# Patient Record
Sex: Male | Born: 1945 | Race: Black or African American | Hispanic: No | Marital: Married | Smoking: Former smoker
Health system: Southern US, Community
[De-identification: ages and names within clinical notes are randomized; demographics above are authoritative.]

## PROBLEM LIST (undated history)

## (undated) DIAGNOSIS — I639 Cerebral infarction, unspecified: Secondary | ICD-10-CM

## (undated) DIAGNOSIS — I10 Essential (primary) hypertension: Secondary | ICD-10-CM

## (undated) HISTORY — DX: Cerebral infarction, unspecified: I63.9

## (undated) HISTORY — DX: Essential (primary) hypertension: I10

---

## 2018-08-31 ENCOUNTER — Ambulatory Visit (INDEPENDENT_AMBULATORY_CARE_PROVIDER_SITE_OTHER): Payer: Medicare HMO | Admitting: Family Medicine

## 2018-08-31 ENCOUNTER — Encounter: Payer: Self-pay | Admitting: Family Medicine

## 2018-08-31 VITALS — BP 130/68 | HR 50 | Temp 97.9°F | Ht 69.0 in | Wt 223.2 lb

## 2018-08-31 DIAGNOSIS — E785 Hyperlipidemia, unspecified: Secondary | ICD-10-CM

## 2018-08-31 DIAGNOSIS — I10 Essential (primary) hypertension: Secondary | ICD-10-CM | POA: Insufficient documentation

## 2018-08-31 DIAGNOSIS — M25572 Pain in left ankle and joints of left foot: Secondary | ICD-10-CM

## 2018-08-31 DIAGNOSIS — K4090 Unilateral inguinal hernia, without obstruction or gangrene, not specified as recurrent: Secondary | ICD-10-CM

## 2018-08-31 DIAGNOSIS — Z8673 Personal history of transient ischemic attack (TIA), and cerebral infarction without residual deficits: Secondary | ICD-10-CM

## 2018-08-31 DIAGNOSIS — L0390 Cellulitis, unspecified: Secondary | ICD-10-CM | POA: Diagnosis not present

## 2018-08-31 HISTORY — DX: Personal history of transient ischemic attack (TIA), and cerebral infarction without residual deficits: Z86.73

## 2018-08-31 HISTORY — DX: Essential (primary) hypertension: I10

## 2018-08-31 HISTORY — DX: Hyperlipidemia, unspecified: E78.5

## 2018-08-31 HISTORY — DX: Unilateral inguinal hernia, without obstruction or gangrene, not specified as recurrent: K40.90

## 2018-08-31 MED ORDER — DOXYCYCLINE HYCLATE 100 MG PO TABS: 100.0000 mg | ORAL_TABLET | Freq: Two times a day (BID) | ORAL | 0 refills | Status: DC

## 2018-08-31 NOTE — Assessment & Plan Note (Signed)
At goal.  Continue Norvasc 10 mg daily, HCTZ 12.5 mg daily, and losartan 25 mg daily.

## 2018-08-31 NOTE — Assessment & Plan Note (Signed)
-  Continue Lipitor 10mg daily

## 2018-08-31 NOTE — Patient Instructions (Signed)
It was very nice to see you today!  I think you have a small infection in your ankle.  We will treat this with an antibiotic.  Please start the doxycycline 100 mg twice daily for the next 7 days.  We need to get an MRI of your ankle make sure that she did not injure your Achilles tendon.  You will be called to get that set up.  Please come back to see me in 3 to 6 months for your follow-up visit, or sooner as needed.  Take care, Dr Jimmey RalphParker

## 2018-08-31 NOTE — Assessment & Plan Note (Signed)
Patient declined exam today.  Does not want surgery.  Discussed warning signs and reasons to seek emergent care.

## 2018-08-31 NOTE — Progress Notes (Signed)
Subjective:  Albert [name] is a 72 y.o. male who presents today with a chief complaint of foot pain and to establish care.  HPI:  Foot Pain, new problem Started a few weeks ago.  [name]  [name] but has had persistent pain to the back of his left ankle.  Pain is worse with walking and with standing.  [name]  No fevers or chills.  No other treatments tried.  No weakness.  No other obvious alleviating or aggravating factors.  Inguinal hernia, chronic problem Started several months ago.  Located in left groin.  Worse with coughing.  Worse with picking up heavy objects.  No nausea or vomiting.  No constipation or diarrhea.  Does not want surgery at this point.  His other chronic, stable medical conditions are outlined below 1.  Hypertension.  On Norvasc 10 mg daily, losartan 25 mg daily, and HCTZ 12.5 mg daily.  Tolerating all these well without side effects. 2.  Dyslipidemia/history of stroke.  On Lipitor 10 mg daily and tolerating well.  ROS: Per HPI, otherwise a complete review of systems was negative.   PMH:  The following were reviewed and entered/updated in epic: Past Medical History:  Diagnosis Date  . Hypertension   . Stroke [facility](HCC)    Patient Active Problem List   Diagnosis Date Noted  . Essential hypertension 08/31/2018  . Dyslipidemia 08/31/2018  . History of stroke 08/31/2018  . Inguinal hernia 08/31/2018   History reviewed. No pertinent surgical history.  Family History  Problem Relation Age of Onset  . Heart disease Mother   . Hypertension Mother   . Stroke Mother   . Heart disease Father   . Hypertension Father   . Heart disease Brother     Medications- reviewed and updated Current Outpatient Medications  Medication Sig Dispense Refill  . amLODipine (NORVASC) 10 MG tablet Take by  mouth daily.    Marland Kitchen. atorvastatin (LIPITOR) 10 MG tablet Take 10 mg by mouth daily.    . hydrochlorothiazide (MICROZIDE) 12.5 MG capsule Take 12.5 mg by mouth daily.    Marland Kitchen. losartan (COZAAR) 25 MG tablet Take by mouth daily.    Marland Kitchen. doxycycline (VIBRA-TABS) 100 MG tablet Take 1 tablet (100 mg total) by mouth 2 (two) times daily. 14 tablet 0   No current facility-administered medications for this visit.     Allergies-reviewed and updated Allergies  Allergen Reactions  . Accupril [Quinapril Hcl] Swelling  . Shellfish Allergy   . Aspirin Nausea Only    Social History   Socioeconomic History  . Marital status: Married    Spouse name: Not on file  . Number of children: Not on file  . Years of education: Not on file  . Highest education level: Not on file  Occupational History  . Not on file  Social Needs  . Financial resource strain: Not on file  . Food insecurity:    Worry: Not on file    Inability: Not on file  . Transportation needs:    Medical: Not on file    Non-medical: Not on file  Tobacco Use  . Smoking status: Former Smoker    Last attempt to quit: 1976    Years since quitting: 44.0  . Smokeless tobacco: Never Used  Substance and Sexual Activity  .  Alcohol use: Not Currently    Frequency: Never  . Drug use: Never  . Sexual activity: Not on file  Lifestyle  . Physical activity:    Days per week: Not on file    Minutes per session: Not on file  . Stress: Not on file  Relationships  . Social connections:    Talks on phone: Not on file    Gets together: Not on file    Attends religious service: Not on file    Active member of club or organization: Not on file    Attends meetings of clubs or organizations: Not on file    Relationship status: Not on file  Other Topics Concern  . Not on file  Social History Narrative  . Not on file    Objective:  Physical Exam: BP 130/68 (BP Location: Left Arm, Patient Position: Sitting, Cuff Size: Large)   Pulse (!) 50   Temp  97.9 F (36.6 C) (Oral)   Ht 5\' 9"  (1.753 m)   Wt 223 lb 4 oz (101.3 kg)   SpO2 99%   BMI 32.97 kg/m   Gen: NAD, resting comfortably CV: RRR with no murmurs appreciated Pulm: NWOB, CTAB with no crackles, wheezes, or rhonchi GI: Normal bowel sounds present. Soft, Nontender, Nondistended. MSK:  -Left foot: Left Achilles tendon with open wound approximately 2-1/2 cm above calcaneus.  Wound is approximately 1-1/2 cm in diameter.  Mild amount of serosanguineous drainage.  Surrounding erythema and edema.  Very tender to palpation.  No obvious areas of fluctuance.  Normal Thompson squeeze test.  Able to do heel raise however this elicits pain. Skin: Warm, dry Neuro: Grossly normal, moves all extremities Psych: Normal affect and thought content  Assessment/Plan:  Left ankle pain/cellulitis Concern for soft tissue infection giving erythema, edema, and open wound.  Start doxycycline 100 mg twice daily for 7 days.  Normal Thompson squeeze test however has significant pain with heel raise.  It is possible he has a partially lacerated Achilles tendon.  Discussed with sports medicine (Dr Berline Choughigby).  Recommended getting MRI to rule out partial Achilles tear.  Discussed reasons return to care and seek emergent care.  History of stroke Continue statin.  Essential hypertension At goal.  Continue Norvasc 10 mg daily, HCTZ 12.5 mg daily, and losartan 25 mg daily.  Dyslipidemia Continue Lipitor 10 mg daily.  Inguinal hernia Patient declined exam today.  Does not want surgery.  Discussed warning signs and reasons to seek emergent care.  Preventative Healthcare [name].  Obtain records from previous PCP. Health Maintenance Due  Topic Date Due  . Hepatitis C Screening  31-Jul-1946  . TETANUS/TDAP  03/05/1965  . COLONOSCOPY  03/05/1996  . PNA vac Low Risk Adult (1 of 2 - PCV13) 03/06/2011   Yussef Jorge M. Jimmey RalphParker, MD 08/31/2018 9:20 AM

## 2018-08-31 NOTE — Assessment & Plan Note (Signed)
Continue statin. 

## 2018-10-19 ENCOUNTER — Encounter: Payer: Self-pay | Admitting: Family Medicine

## 2018-10-19 ENCOUNTER — Ambulatory Visit: Payer: Medicare HMO | Admitting: Family Medicine

## 2018-10-19 VITALS — BP 128/72 | HR 67 | Temp 98.4°F | Ht 69.0 in | Wt 183.2 lb

## 2018-10-19 DIAGNOSIS — R109 Unspecified abdominal pain: Secondary | ICD-10-CM | POA: Diagnosis not present

## 2018-10-19 DIAGNOSIS — R1030 Lower abdominal pain, unspecified: Secondary | ICD-10-CM

## 2018-10-19 LAB — POCT URINALYSIS DIPSTICK
Bilirubin, UA: NEGATIVE
Blood, UA: NEGATIVE
Glucose, UA: NEGATIVE
Ketones, UA: NEGATIVE
Leukocytes, UA: NEGATIVE
Nitrite, UA: NEGATIVE
Protein, UA: NEGATIVE
Spec Grav, UA: 1.015 (ref 1.010–1.025)
Urobilinogen, UA: 2 E.U./dL — AB
pH, UA: 7.5 (ref 5.0–8.0)

## 2018-10-19 NOTE — Progress Notes (Signed)
   Chief Complaint:  Albert [name] is a 73 y.o. male who presents today with a chief complaint of left flank pain.   Assessment/Plan:  Left Flank Pain No red flags.  Benign abdominal exam with no signs of acute abdomen.  UA without blood or signs of infection.  Symptoms likely coming from his inguinal hernia.  Will check CT scan of abdomen pelvis with contrast to further evaluate this and rule out other potential causes.  His pain is currently tolerable-recommended over-the-counter analgesics as needed.  Discussed reasons to return to care and seek emergent care.    Subjective:  HPI:  Left Flank Pain Started 4 to 5 weeks ago.  Stable over that time.  No clear precipitating event.  He has had a history of a left inguinal hernia but is not sure if this is contributing or not.  No dysuria.  No hematuria.  No nausea or vomiting.  No constipation or diarrhea.  He has 2 soft bowel movements daily.  No recent illnesses.  No fevers or chills.  No treatments tried.  Symptoms seem to be worse in the morning and when getting up.  No other obvious alleviating or aggravating factors.  ROS: Per HPI  PMH: He reports that he quit smoking about 44 years ago. He has never used smokeless tobacco. He reports previous alcohol use. He reports that he does not use drugs.      Objective:  Physical Exam: BP 128/72 (BP Location: Left Arm, Patient Position: Sitting, Cuff Size: Normal)   Pulse 67   Temp 98.4 F (36.9 C) (Oral)   Ht 5\' 9"  (1.753 m)   Wt 183 lb 3.2 oz (83.1 kg)   SpO2 97%   BMI 27.05 kg/m   Gen: NAD, resting comfortably CV: Regular rate and rhythm with no murmurs appreciated Pulm: Normal work of breathing, clear to auscultation bilaterally with no crackles, wheezes, or rhonchi GI: Normal bowel sounds present. Soft, mild tenderness to palpation in left lower quadrant.  No rebound or guarding. GU: Deferred.   Results for orders placed or performed in visit on 10/19/18 (from the past 24 hour(s))   POCT urinalysis dipstick     Status: Abnormal   Collection Time: 10/19/18  2:16 PM  Result Value Ref Range   Color, UA yellow    Clarity, UA clear    Glucose, UA Negative Negative   Bilirubin, UA Negative    Ketones, UA Negative    Spec Grav, UA 1.015 1.010 - 1.025   Blood, UA Negative    pH, UA 7.5 5.0 - 8.0   Protein, UA Negative Negative   Urobilinogen, UA 2.0 (A) 0.2 or 1.0 E.U./dL   Nitrite, UA Negative    Leukocytes, UA Negative Negative   Appearance     Odor           Bralyn Espino M. [name], MD 10/19/2018 2:31 PM

## 2018-10-19 NOTE — Patient Instructions (Signed)
It was very nice to see you today!  Your pain could potentially be coming from your hernia.  We will check a CT scan to make sure there is nothing else going on.  Please let us know if your pain significantly worsens or if you develop any other symptoms.  Take care, Dr [name]

## 2019-07-23 ENCOUNTER — Ambulatory Visit: Payer: Medicare HMO

## 2019-12-24 ENCOUNTER — Ambulatory Visit: Payer: Medicare HMO

## 2020-01-09 ENCOUNTER — Encounter: Payer: Self-pay | Admitting: Family Medicine

## 2020-01-09 ENCOUNTER — Encounter: Payer: Self-pay | Admitting: Internal Medicine

## 2020-01-09 ENCOUNTER — Ambulatory Visit (INDEPENDENT_AMBULATORY_CARE_PROVIDER_SITE_OTHER): Payer: Medicare (Managed Care) | Admitting: Family Medicine

## 2020-01-09 ENCOUNTER — Other Ambulatory Visit: Payer: Self-pay

## 2020-01-09 VITALS — BP 140/80 | HR 65 | Temp 98.4°F | Ht 69.0 in | Wt 181.2 lb

## 2020-01-09 DIAGNOSIS — Z1211 Encounter for screening for malignant neoplasm of colon: Secondary | ICD-10-CM | POA: Diagnosis not present

## 2020-01-09 DIAGNOSIS — M5441 Lumbago with sciatica, right side: Secondary | ICD-10-CM

## 2020-01-09 DIAGNOSIS — M5442 Lumbago with sciatica, left side: Secondary | ICD-10-CM

## 2020-01-09 MED ORDER — DICLOFENAC SODIUM 75 MG PO TBEC: 75.0000 mg | DELAYED_RELEASE_TABLET | Freq: Two times a day (BID) | ORAL | 0 refills | Status: DC

## 2020-01-09 NOTE — Patient Instructions (Signed)
It was very nice to see you today!  Please start the anti-inflammatory.  Continue with a heating pad and work on the exercises.  Letter know if not proving the next 1 to 2 weeks.  I will also place referral for you get your colon cancer screening.  Take care, Dr [name]  Please try these tips to maintain a healthy lifestyle:   Eat at least 3 REAL meals and 1-2 snacks per day.  Aim for no more than 5 hours between eating.  If you eat breakfast, please do so within one hour of getting up.    Each meal should contain half fruits/vegetables, one quarter protein, and one quarter carbs (no bigger than a computer mouse)   Cut down on sweet beverages. This includes juice, soda, and sweet tea.     Drink at least 1 glass of water with each meal and aim for at least 8 glasses per day   Exercise at least 150 minutes every week.

## 2020-01-09 NOTE — Progress Notes (Signed)
   Zyon Rosser is a 74 y.o. male who presents today for an office visit.  Assessment/Plan:  New/Acute Problems: Low back pain with sciatica No red flags.  Start diclofenac 75 mg twice daily for the next 1 to 2 weeks.  Discussed home exercise program and handout was given.  If not improving in the next week or 2 will need x-ray.  May consider referral to PT and/or sports med if not improving.  Preventive healthcare We will place referral for colon cancer screening per patient request.   Subjective:  HPI:  Patient with low back pain for the past few weeks.  Stable over that time.  Located bilateral lower back that radiates into bilateral lower legs.  Describes a soreness sensation.  Has tried using a warm towel which is helped.  No specific precipitating events or injuries.  No medications tried.  No bowel or bladder incontinence.  No urinary retention.       Objective:  Physical Exam: BP 140/80 (BP Location: Left Arm, Patient Position: Sitting, Cuff Size: Large)   Pulse 65   Temp 98.4 F (36.9 C) (Temporal)   Ht 5\' 9"  (1.753 m)   Wt 181 lb 3.2 oz (82.2 kg)   SpO2 98%   BMI 26.76 kg/m   Gen: No acute distress, resting comfortably MSK: -Back: No deformities.  Slightly tender to palpation along bilateral lumbar paraspinal muscles -Lower extremities: No deformities.  Forage motion throughout.  Strength 5 out of 5 throughout.  Sensation light touch intact throughout. Psych: Normal affect and thought content      Miasha Emmons M. , MD 01/09/2020 10:35 AM

## 2020-02-15 ENCOUNTER — Other Ambulatory Visit: Payer: Self-pay

## 2020-02-18 ENCOUNTER — Encounter: Payer: Self-pay | Admitting: Family Medicine

## 2020-02-18 ENCOUNTER — Ambulatory Visit (INDEPENDENT_AMBULATORY_CARE_PROVIDER_SITE_OTHER): Payer: Medicare (Managed Care) | Admitting: Family Medicine

## 2020-02-18 ENCOUNTER — Other Ambulatory Visit: Payer: Self-pay

## 2020-02-18 ENCOUNTER — Ambulatory Visit (INDEPENDENT_AMBULATORY_CARE_PROVIDER_SITE_OTHER)
Admission: RE | Admit: 2020-02-18 | Discharge: 2020-02-18 | Disposition: A | Discharge status: Home / Self Care | Payer: Medicare (Managed Care) | Source: Ambulatory Visit | Attending: Family Medicine | Admitting: Family Medicine

## 2020-02-18 ENCOUNTER — Encounter: Payer: Self-pay | Admitting: Internal Medicine

## 2020-02-18 VITALS — BP 138/72 | HR 61 | Temp 98.0°F | Ht 69.0 in | Wt 181.0 lb

## 2020-02-18 DIAGNOSIS — M5442 Lumbago with sciatica, left side: Secondary | ICD-10-CM

## 2020-02-18 DIAGNOSIS — Z1211 Encounter for screening for malignant neoplasm of colon: Secondary | ICD-10-CM

## 2020-02-18 DIAGNOSIS — M5441 Lumbago with sciatica, right side: Secondary | ICD-10-CM

## 2020-02-18 MED ORDER — DICLOFENAC SODIUM 75 MG PO TBEC: 75.0000 mg | DELAYED_RELEASE_TABLET | Freq: Two times a day (BID) | ORAL | 0 refills | Status: DC

## 2020-02-18 NOTE — Progress Notes (Signed)
   [place]fle[place] Capers is a 74 y.o. male who presents today for an office visit.  Assessment/Plan:  New/Acute Problems: Low Back Pain No red flags though given symptoms of pain recurring over the past 6 weeks or so will check x-ray today.  Restart diclofenac.  Advised him to avoid any activities that worsen his back pain over the next 4 to 6 weeks.  Work note was given today.  If not improving the next 2 weeks would consider referral to sports medicine.    Subjective:  HPI:  Patient here for back pain.  Last seen about 6 weeks ago.  Started on diclofenac.  Symptoms improved however worsened over the last few weeks.  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  Pain in the low back radiates into bilateral legs.  No reported bowel or bladder incontinence.  No reported no urinary retention.       Objective:  Physical Exam: BP 138/72   Pulse 61   Temp 98 F (36.7 C)   Ht 5\' 9"  (1.753 m)   Wt 181 lb (82.1 kg)   SpO2 97%   BMI 26.73 kg/m   Gen: No acute distress, resting comfortably CV: Regular rate and rhythm with no murmurs appreciated Pulm: Normal work of breathing, clear to auscultation bilaterally with no crackles, wheezes, or rhonchi Neuro: Grossly normal, moves all extremities Psych: Normal affect and thought content      Corene Resnick M. , MD 02/18/2020 11:42 AM

## 2020-02-18 NOTE — Patient Instructions (Signed)
It was very nice to see you today!  Please restart the diclofenac.  We will check an x-ray today.  Please avoid strenuous activity for the next 4 to 6 weeks.  Please let me know if not improving and I will refer you to see a sports medicine doctor.  Take care, Dr [name]  Please try these tips to maintain a healthy lifestyle:   Eat at least 3 REAL meals and 1-2 snacks per day.  Aim for no more than 5 hours between eating.  If you eat breakfast, please do so within one hour of getting up.    Each meal should contain half fruits/vegetables, one quarter protein, and one quarter carbs (no bigger than a computer mouse)   Cut down on sweet beverages. This includes juice, soda, and sweet tea.     Drink at least 1 glass of water with each meal and aim for at least 8 glasses per day   Exercise at least 150 minutes every week.

## 2020-02-19 NOTE — Progress Notes (Signed)
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 Would like for him to continue with his current treatment plan and let me know if not improving.  Katina Degree. [name], MD 02/19/2020 9:17 AM

## 2020-03-08 ENCOUNTER — Other Ambulatory Visit: Payer: Self-pay | Admitting: Family Medicine

## 2020-03-17 ENCOUNTER — Other Ambulatory Visit: Payer: Self-pay

## 2020-03-17 ENCOUNTER — Ambulatory Visit (INDEPENDENT_AMBULATORY_CARE_PROVIDER_SITE_OTHER): Payer: Medicare (Managed Care)

## 2020-03-17 DIAGNOSIS — Z1211 Encounter for screening for malignant neoplasm of colon: Secondary | ICD-10-CM

## 2020-03-17 DIAGNOSIS — Z0000 Encounter for general adult medical examination without abnormal findings: Secondary | ICD-10-CM | POA: Diagnosis not present

## 2020-03-17 NOTE — Progress Notes (Addendum)
Subjective:   Albert [name] is a 74 y.o. male who presents for an Initial Medicare Annual Wellness Visit.  Virtual Visit via Telephone Note  I connected with  Albert [name] on 03/17/20 at  3:15 PM EDT by telephone and verified that I am speaking with the correct person using two identifiers.  Medicare Annual Wellness visit completed telephonically due to Covid-19 pandemic.   Persons participating in this call: This Health Coach and this patient.   Location: Patient: Home Provider: Office   I discussed the limitations, risks, security and privacy concerns of performing an evaluation and management service by telephone and the availability of in person appointments. The patient expressed understanding and agreed to proceed.  Unable to perform video visit due to video visit attempted and failed and/or patient does not have video capability.   Some vital signs may be absent or patient reported.   [name], LPN       Review of Systems     Cardiac Risk Factors include: hypertension;male gender;dyslipidemia     Objective:    There were no vitals filed for this visit. There is no height or weight on file to calculate BMI.  Advanced Directives 03/17/2020  Does Patient Have a Medical Advance Directive? Yes  Type of Estate agent of [place];Living will  Copy of Healthcare Power of Attorney in Chart? No - copy requested    Current Medications (verified) Outpatient Encounter Medications as of 03/17/2020  Medication Sig  . amLODipine (NORVASC) 10 MG tablet Take by mouth daily.  Marland Kitchen atorvastatin (LIPITOR) 10 MG tablet Take 10 mg by mouth daily.  . diclofenac (VOLTAREN) 75 MG EC tablet TAKE 1 TABLET BY MOUTH TWICE A DAY  . hydrochlorothiazide (MICROZIDE) 12.5 MG capsule Take 12.5 mg by mouth daily.  Marland Kitchen losartan (COZAAR) 25 MG tablet Take by mouth daily.   No facility-administered encounter medications on file as of 03/17/2020.    Allergies  (verified) Accupril [quinapril hcl], Shellfish allergy, and Aspirin   History: Past Medical History:  Diagnosis Date  . Hypertension   . Stroke [facility])    History reviewed. No pertinent surgical history. Family History  Problem Relation Age of Onset  . Heart disease Mother   . Hypertension Mother   . Stroke Mother   . Heart disease Father   . Hypertension Father   . Heart disease Brother    Social History   Socioeconomic History  . Marital status: Married    Spouse name: Not on file  . Number of children: Not on file  . Years of education: Not on file  . Highest education level: Not on file  Occupational History  . Not on file  Tobacco Use  . Smoking status: Former Smoker    Quit date: 1976    Years since quitting: 45.5  . Smokeless tobacco: Never Used  Vaping Use  . Vaping Use: Never used  Substance and Sexual Activity  . Alcohol use: Not Currently  . Drug use: Never  . Sexual activity: Not on file  Other Topics Concern  . Not on file  Social History Narrative  . Not on file   Social Determinants of Health   Financial Resource Strain: Low Risk   . Difficulty of Paying Living Expenses: Not hard at all  Food Insecurity: No Food Insecurity  . Worried About Programme researcher, broadcasting/film/video in the Last Year: Never true  . Ran Out of Food in the Last Year: Never true  Transportation  Needs: No Transportation Needs  . Lack of Transportation (Medical): No  . Lack of Transportation (Non-Medical): No  Physical Activity: Insufficiently Active  . Days of Exercise per Week: 5 days  . Minutes of Exercise per Session: 10 min  Stress: No Stress Concern Present  . Feeling of Stress : Not at all  Social Connections: Moderately Isolated  . Frequency of Communication with Friends and Family: More than three times a week  . Frequency of Social Gatherings with Friends and Family: More than three times a week  . Attends Religious Services: More than 4 times per year  . Active Member of  Clubs or Organizations: No  . Attends Banker Meetings: Never  . Marital Status: Divorced    Tobacco Counseling Counseling given: Not Answered   Clinical Intake:  Pre-visit preparation completed: Yes  Pain : No/denies pain     BMI - recorded: 26.73 Nutritional Status: BMI 25 -29 Overweight Nutritional Risks: None Diabetes: No  How often do you need to have someone help you when you read instructions, pamphlets, or other written materials from your doctor or pharmacy?: 1 - Never  Diabetic?No  Interpreter Needed?: No  Information entered by :: [name], LPN   Activities of Daily Living In your present state of health, do you have any difficulty performing the following activities: 03/17/2020  Hearing? N  Vision? N  Difficulty concentrating or making decisions? N  Walking or climbing stairs? Y  Comment left knee problem at times  Dressing or bathing? N  Doing errands, shopping? N  Preparing Food and eating ? N  Using the Toilet? N  In the past six months, have you accidently leaked urine? N  Managing your Medications? N  Managing your Finances? N  Housekeeping or managing your Housekeeping? N  Some recent data might be hidden    Patient Care Team: Ardith Dark, MD as PCP - General (Family Medicine)  Indicate any recent Medical Services you may have received from other than [facility] providers in the past year (date may be approximate).     Assessment:   This is a routine wellness examination for [facility].  Hearing/Vision screen  Hearing Screening   125Hz  250Hz  500Hz  1000Hz  2000Hz  3000Hz  4000Hz  6000Hz  8000Hz   Right ear:           Left ear:           Comments: Pt denies any hearing difficulty  Vision Screening Comments: Wears reading eyeglasses and needs a referral   Dietary issues and exercise activities discussed: Current Exercise Habits: Home exercise routine, Type of exercise: walking, Time (Minutes): 10, Frequency (Times/Week): 5,  Weekly Exercise (Minutes/Week): 50, Intensity: Mild, Exercise limited by: cardiac condition(s);orthopedic condition(s)  Goals    . Patient Stated     Goal to become debt free and stay healthy      Depression Screen PHQ 2/9 Scores 03/17/2020 08/31/2018  PHQ - 2 Score 0 0    Fall Risk Fall Risk  03/17/2020 08/31/2018  Falls in the past year? 0 0  Number falls in past yr: 0 -  Injury with Fall? 0 -  Risk for fall due to : Impaired vision;Other (Comment) -  Follow up Falls prevention discussed -    Any stairs in or around the home? Yes  If so, are there any without handrails? No  Home free of loose throw rugs in walkways, pet beds, electrical cords, etc? Yes  Adequate lighting in your home to reduce risk of  falls? Yes   ASSISTIVE DEVICES UTILIZED TO PREVENT FALLS:  Life alert? No  Use of a cane, walker or w/c? No  Grab bars in the bathroom? Yes  Shower chair or bench in shower? No  Elevated toilet seat or a handicapped toilet? No   TIMED UP AND GO:  Was the test performed? No   Cognitive Function:     6CIT Screen 03/17/2020  What Year? 0 points  What month? 0 points  What time? 0 points  Count back from 20 0 points  Months in reverse 2 points  Repeat phrase 0 points  Total Score 2    Immunizations Immunization History  Administered Date(s) Administered  . Tdap 01/30/2015    TDAP status: Up to date Flu Vaccine status: Declined, Education has been provided regarding the importance of this vaccine but patient still declined. Advised may receive this vaccine at local pharmacy or Health Dept. Aware to provide a copy of the vaccination record if obtained from local pharmacy or Health Dept. Verbalized acceptance and understanding.  Pneumococcal vaccine status: Declined,  Education has been provided regarding the importance of this vaccine but patient still declined. Advised may receive this vaccine at local pharmacy or Health Dept. Aware to provide a copy of the  vaccination record if obtained from local pharmacy or Health Dept. Verbalized acceptance and understanding.  Covid-19 vaccine status: Declined, Education has been provided regarding the importance of this vaccine but patient still declined. Advised may receive this vaccine at local pharmacy or Health Dept.or vaccine clinic. Aware to provide a copy of the vaccination record if obtained from local pharmacy or Health Dept. Verbalized acceptance and understanding.  Qualifies for Shingles Vaccine? Yes   Zostavax completed No   Shingrix Completed?: No.    Education has been provided regarding the importance of this vaccine. Patient has been advised to call insurance company to determine out of pocket expense if they have not yet received this vaccine. Advised may also receive vaccine at local pharmacy or Health Dept. Verbalized acceptance and understanding.  Screening Tests Health Maintenance  Topic Date Due  . Hepatitis C Screening  Never done  . COVID-19 Vaccine (1) Never done  . COLONOSCOPY  Never done  . PNA vac Low Risk Adult (1 of 2 - PCV13) Never done  . TETANUS/TDAP  01/29/2025  . INFLUENZA VACCINE  Discontinued    Health Maintenance  Health Maintenance Due  Topic Date Due  . Hepatitis C Screening  Never done  . COVID-19 Vaccine (1) Never done  . COLONOSCOPY  Never done  . PNA vac Low Risk Adult (1 of 2 - PCV13) Never done    Colorectal cancer screening: Gi referral needed    Additional Screening:  Vision Screening: Recommended annual ophthalmology exams for early detection of glaucoma and other disorders of the eye. Is the patient up to date with their annual eye exam?  Yes  If pt is not established with a provider, would they like to be referred to a provider to establish care? Yes .   Dental Screening: Recommended annual dental exams for proper oral hygiene  Community Resource Referral / Chronic Care Management: CRR required this visit?  No   CCM required this visit?   No      Plan:     I have personally reviewed and noted the following in the patient's chart:   . Medical and social history . Use of alcohol, tobacco or illicit drugs  . Current medications and supplements .  Functional ability and status . Nutritional status . Physical activity . Advanced directives . List of other physicians . Hospitalizations, surgeries, and ER visits in previous 12 months . Vitals . Screenings to include cognitive, depression, and falls . Referrals and appointments  In addition, I have reviewed and discussed with patient certain preventive protocols, quality metrics, and best practice recommendations. A written personalized care plan for preventive services as well as general preventive health recommendations were provided to patient.     [name], LPN   [id number]   Nurse Notes: None

## 2020-03-17 NOTE — Patient Instructions (Addendum)
Albert [name] , Thank you for taking time to come for your Medicare Wellness Visit. I appreciate your ongoing commitment to your health goals. Please review the following plan we discussed and let me know if I can assist you in the future.   Screening recommendations/referrals: Colonoscopy: Due  Recommended yearly ophthalmology/optometry visit for glaucoma screening and checkup Recommended yearly dental visit for hygiene and checkup  Vaccinations: Influenza vaccine: Declined vaccination/ Pneumococcal vaccine:  Education given Tdap vaccine: Done 01/30/15 Shingles vaccine: Shingrix discussed. Please contact your pharmacy for coverage information.    Covid-19: declines at this time Education given  All vaccines pt will receive information in the mail, Patient stated some he has taken and will get reports from Dr in [place]  Advanced directives: Please bring a copy of your health care power of attorney and living will to the office at your convenience.  Conditions/risks identified: Stay healthy and become debt free  Next appointment: Follow up in one year for your annual wellness visit.   Preventive Care 74 Years and Older, Male Preventive care refers to lifestyle choices and visits with your health care provider that can promote health and wellness. What does preventive care include?  A yearly physical exam. This is also called an annual well check.  Dental exams once or twice a year.  Routine eye exams. Ask your health care provider how often you should have your eyes checked.  Personal lifestyle choices, including:  Daily care of your teeth and gums.  Regular physical activity.  Eating a healthy diet.  Avoiding tobacco and drug use.  Limiting alcohol use.  Practicing safe sex.  Taking low doses of aspirin every day.  Taking vitamin and mineral supplements as recommended by your health care provider. What happens during an annual well check? The services and screenings  done by your health care provider during your annual well check will depend on your age, overall health, lifestyle risk factors, and family history of disease. Counseling  Your health care provider may ask you questions about your:  Alcohol use.  Tobacco use.  Drug use.  Emotional well-being.  Home and relationship well-being.  Sexual activity.  Eating habits.  History of falls.  Memory and ability to understand (cognition).  Work and work Astronomer. Screening  You may have the following tests or measurements:  Height, weight, and BMI.  Blood pressure.  Lipid and cholesterol levels. These may be checked every 5 years, or more frequently if you are over 26 years old.  Skin check.  Lung cancer screening. You may have this screening every year starting at age 74 if you have a 30-pack-year history of smoking and currently smoke or have quit within the past 15 years.  Fecal occult blood test (FOBT) of the stool. You may have this test every year starting at age 74.  Flexible sigmoidoscopy or colonoscopy. You may have a sigmoidoscopy every 5 years or a colonoscopy every 10 years starting at age 74.  Prostate cancer screening. Recommendations will vary depending on your family history and other risks.  Hepatitis C blood test.  Hepatitis B blood test.  Sexually transmitted disease (STD) testing.  Diabetes screening. This is done by checking your blood sugar (glucose) after you have not eaten for a while (fasting). You may have this done every 1-3 years.  Abdominal aortic aneurysm (AAA) screening. You may need this if you are a current or former smoker.  Osteoporosis. You may be screened starting at age 31 if you are  at high risk. Talk with your health care provider about your test results, treatment options, and if necessary, the need for more tests. Vaccines  Your health care provider may recommend certain vaccines, such as:  Influenza vaccine. This is recommended  every year.  Tetanus, diphtheria, and acellular pertussis (Tdap, Td) vaccine. You may need a Td booster every 10 years.  Zoster vaccine. You may need this after age 74.  Pneumococcal 13-valent conjugate (PCV13) vaccine. One dose is recommended after age 75.  Pneumococcal polysaccharide (PPSV23) vaccine. One dose is recommended after age 74. Talk to your health care provider about which screenings and vaccines you need and how often you need them. This information is not intended to replace advice given to you by your health care provider. Make sure you discuss any questions you have with your health care provider. Document Released: 09/19/2015 Document Revised: 05/12/2016 Document Reviewed: 06/24/2015 Elsevier Interactive Patient Education  2017 [employer] Prevention in the Home Falls can cause injuries. They can happen to people of all ages. There are many things you can do to make your home safe and to help prevent falls. What can I do on the outside of my home?  Regularly fix the edges of walkways and driveways and fix any cracks.  Remove anything that might make you trip as you walk through a door, such as a raised step or threshold.  Trim any bushes or trees on the path to your home.  Use bright outdoor lighting.  Clear any walking paths of anything that might make someone trip, such as rocks or tools.  Regularly check to see if handrails are loose or broken. Make sure that both sides of any steps have handrails.  Any raised decks and porches should have guardrails on the edges.  Have any leaves, snow, or ice cleared regularly.  Use sand or salt on walking paths during winter.  Clean up any spills in your garage right away. This includes oil or grease spills. What can I do in the bathroom?  Use night lights.  Install grab bars by the toilet and in the tub and shower. Do not use towel bars as grab bars.  Use non-skid mats or decals in the tub or shower.  If  you need to sit down in the shower, use a plastic, non-slip stool.  Keep the floor dry. Clean up any water that spills on the floor as soon as it happens.  Remove soap buildup in the tub or shower regularly.  Attach bath mats securely with double-sided non-slip rug tape.  Do not have throw rugs and other things on the floor that can make you trip. What can I do in the bedroom?  Use night lights.  Make sure that you have a light by your bed that is easy to reach.  Do not use any sheets or blankets that are too big for your bed. They should not hang down onto the floor.  Have a firm chair that has side arms. You can use this for support while you get dressed.  Do not have throw rugs and other things on the floor that can make you trip. What can I do in the kitchen?  Clean up any spills right away.  Avoid walking on wet floors.  Keep items that you use a lot in easy-to-reach places.  If you need to reach something above you, use a strong step stool that has a grab bar.  Keep electrical cords out of  the way.  Do not use floor polish or wax that makes floors slippery. If you must use wax, use non-skid floor wax.  Do not have throw rugs and other things on the floor that can make you trip. What can I do with my stairs?  Do not leave any items on the stairs.  Make sure that there are handrails on both sides of the stairs and use them. Fix handrails that are broken or loose. Make sure that handrails are as long as the stairways.  Check any carpeting to make sure that it is firmly attached to the stairs. Fix any carpet that is loose or worn.  Avoid having throw rugs at the top or bottom of the stairs. If you do have throw rugs, attach them to the floor with carpet tape.  Make sure that you have a light switch at the top of the stairs and the bottom of the stairs. If you do not have them, ask someone to add them for you. What else can I do to help prevent falls?  Wear shoes  that:  Do not have high heels.  Have rubber bottoms.  Are comfortable and fit you well.  Are closed at the toe. Do not wear sandals.  If you use a stepladder:  Make sure that it is fully opened. Do not climb a closed stepladder.  Make sure that both sides of the stepladder are locked into place.  Ask someone to hold it for you, if possible.  Clearly mark and make sure that you can see:  Any grab bars or handrails.  First and last steps.  Where the edge of each step is.  Use tools that help you move around (mobility aids) if they are needed. These include:  Canes.  Walkers.  Scooters.  Crutches.  Turn on the lights when you go into a dark area. Replace any light bulbs as soon as they burn out.  Set up your furniture so you have a clear path. Avoid moving your furniture around.  If any of your floors are uneven, fix them.  If there are any pets around you, be aware of where they are.  Review your medicines with your doctor. Some medicines can make you feel dizzy. This can increase your chance of falling. Ask your doctor what other things that you can do to help prevent falls. This information is not intended to replace advice given to you by your health care provider. Make sure you discuss any questions you have with your health care provider. Document Released: 06/19/2009 Document Revised: 01/29/2016 Document Reviewed: 09/27/2014 Elsevier Interactive Patient Education  2017 [employer].

## 2020-04-11 LAB — COLOGUARD: Cologuard: NEGATIVE

## 2020-04-17 ENCOUNTER — Encounter: Payer: Self-pay | Admitting: Family Medicine

## 2020-04-21 ENCOUNTER — Telehealth: Payer: Self-pay

## 2020-04-21 NOTE — Telephone Encounter (Signed)
Pt would like a form saying he can go back to work. One was written last month for him, but he did not return to work.

## 2020-04-21 NOTE — Telephone Encounter (Signed)
Please call patient to schedule a appt

## 2020-04-22 NOTE — Telephone Encounter (Signed)
Appt made

## 2020-04-23 ENCOUNTER — Telehealth: Payer: Self-pay | Admitting: Family Medicine

## 2020-04-23 ENCOUNTER — Encounter: Payer: Self-pay | Admitting: Family Medicine

## 2020-04-23 ENCOUNTER — Telehealth (INDEPENDENT_AMBULATORY_CARE_PROVIDER_SITE_OTHER): Payer: Medicare (Managed Care) | Admitting: Family Medicine

## 2020-04-23 VITALS — Ht 69.0 in | Wt 186.0 lb

## 2020-04-23 DIAGNOSIS — M5441 Lumbago with sciatica, right side: Secondary | ICD-10-CM

## 2020-04-23 DIAGNOSIS — M5442 Lumbago with sciatica, left side: Secondary | ICD-10-CM

## 2020-04-23 MED ORDER — DICLOFENAC SODIUM 75 MG PO TBEC: 75.0000 mg | DELAYED_RELEASE_TABLET | Freq: Two times a day (BID) | ORAL | 5 refills | Status: DC

## 2020-04-23 NOTE — Telephone Encounter (Signed)
Patient states the work note was not efficient enough that his employer is wanting a clearance from a cardiologist.

## 2020-04-23 NOTE — Progress Notes (Signed)
   Albert [name] is a 74 y.o. male who presents today for a virtual office visit.  Assessment/Plan:  New/Acute Problems: Low back pain with sciatica Seems to be improving.  We will continue conservative management with diclofenac, heating pad, and stretching exercises.  Discussed referral to PT however he deferred for the time being.  Will let us know if symptoms do not continue to improve.    Subjective:  HPI:  Patient here for follow-up of low back pain with sciatica.  Seen about 2 months ago.  Treated conservatively.  Symptoms have significantly improved but not resolved.  Requests work note to be released back to full activity. Refilled diclofenac.       Objective/Observations  Physical Exam: Gen: NAD, resting comfortably Pulm: Normal work of breathing Neuro: Grossly normal, moves all extremities Psych: Normal affect and thought content  Virtual Visit via Video   I connected with [name] on 04/23/20 at 11:20 AM EDT by a video enabled telemedicine application and verified that I am speaking with the correct person using two identifiers. The limitations of evaluation and management by telemedicine and the availability of in person appointments were discussed. The patient expressed understanding and agreed to proceed.   Patient location: Home Provider location: [place] [employer] Persons participating in the virtual visit: Myself and Patient     Albert [name]. Albert Ralph, MD 04/23/2020 9:35 AM

## 2020-04-24 ENCOUNTER — Other Ambulatory Visit: Payer: Self-pay

## 2020-04-24 DIAGNOSIS — Z8673 Personal history of transient ischemic attack (TIA), and cerebral infarction without residual deficits: Secondary | ICD-10-CM

## 2020-04-24 NOTE — Telephone Encounter (Signed)
Please place referal to cardiology.  Katina Degree. [name], MD 04/24/2020 9:01 AM

## 2020-04-24 NOTE — Telephone Encounter (Signed)
Referral Placed 

## 2020-04-24 NOTE — Telephone Encounter (Signed)
Please advised  

## 2020-04-29 ENCOUNTER — Telehealth: Payer: Self-pay | Admitting: Internal Medicine

## 2020-04-29 NOTE — Telephone Encounter (Signed)
Albert [name] is calling stating someone called him for a sooner appointment yesterday with Albert [name] that was supposed to be scheduled for today. I advised Albert [name] I am unable to find documentation in regards to the call to know who he spoke with and apologized for the enconvience. I then checked Albert [name] scheduled for a sooner appointment and advised she did have a virtual New Patient appt open at 9:00 AM today, but the time frame to schedule for that appt had passed at the time of the call. Albert [name] requested I send a message to try and determine who called him yesterday due to wanting to speak with them again in regards to it, to understand where the miscommunication happened. I did reschedule him for a sooner appt at the [facility] office and added him to the waitlist. Please advise.

## 2020-04-30 NOTE — Telephone Encounter (Signed)
Spoke to patient he stated he already has appointment scheduled with Dr.Mahesh Chandrasekhar 9/10 at 4:00 pm.

## 2020-05-16 ENCOUNTER — Encounter: Payer: Self-pay | Admitting: Internal Medicine

## 2020-05-16 ENCOUNTER — Other Ambulatory Visit: Payer: Self-pay

## 2020-05-16 ENCOUNTER — Ambulatory Visit (INDEPENDENT_AMBULATORY_CARE_PROVIDER_SITE_OTHER): Payer: Medicare (Managed Care) | Admitting: Internal Medicine

## 2020-05-16 ENCOUNTER — Ambulatory Visit: Payer: Medicare (Managed Care) | Admitting: Internal Medicine

## 2020-05-16 VITALS — BP 130/68 | HR 73 | Ht 69.0 in | Wt 184.0 lb

## 2020-05-16 DIAGNOSIS — I491 Atrial premature depolarization: Secondary | ICD-10-CM | POA: Diagnosis not present

## 2020-05-16 DIAGNOSIS — I10 Essential (primary) hypertension: Secondary | ICD-10-CM

## 2020-05-16 DIAGNOSIS — I639 Cerebral infarction, unspecified: Secondary | ICD-10-CM

## 2020-05-16 NOTE — Progress Notes (Signed)
Cardiology Office Note:    Date:  05/16/2020   ID:  Albert [name], DOB February 21, 1946, MRN [id number]  PCP:  Ardith Dark, MD  [facility] Cardiologist:  No primary care provider on file.  [facility] Electrophysiologist:  None   Referring MD: Ardith Dark, MD   New Patient for mgmt after stroke.  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 Jacquiline Doe.  History of Present Illness:    Albert [name] is a 74 y.o. male with a hx of HTN, HLD (patient denies), Stroke 2018.  Patient sees cardiologist in [place].  Patient works for [employer].  Patient has DOT license.  Patient is under the impression that he had a blockage in a vessel in his brain, no bleed.  Presented with leg weakness.  No stent and tPA.  Has had no residual weakness.  In 2018, patient had a LHC that revealed widely patent arteries (per patient).  Had treadmill stress test was unremarkable.  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  Patient notes no chest pain, shortness of breath, no palpitations  Has hx of PACs.  Able to cut down trees with a chainsaw without SOB or DOE.No leg weakness or arm weakness.  No syncope.  Past Medical History:  Diagnosis Date  . Dyslipidemia 08/31/2018  . Essential hypertension 08/31/2018  . History of stroke 08/31/2018  . Hypertension   . Inguinal hernia 08/31/2018  . Stroke [facility])     No past surgical history on file.  Current Medications: Current Meds  Medication Sig  . amLODipine (NORVASC) 10 MG tablet Take by mouth daily.  Marland Kitchen atorvastatin (LIPITOR) 10 MG tablet Take 10 mg by mouth daily.  . diclofenac (VOLTAREN) 75 MG EC tablet Take 1 tablet (75 mg total) by mouth 2 (two) times daily. (Patient taking differently: Take 75 mg by mouth as needed. )  . hydrochlorothiazide (MICROZIDE) 12.5 MG capsule Take 12.5 mg by mouth daily.  Marland Kitchen losartan (COZAAR) 25 MG tablet Take by mouth daily.     Allergies:    Accupril [quinapril hcl], Shellfish allergy, and Aspirin   Social History   Socioeconomic History  . Marital status: Married    Spouse name: Not on file  . Number of children: Not on file  . Years of education: Not on file  . Highest education level: Not on file  Occupational History  . Not on file  Tobacco Use  . Smoking status: Former Smoker    Quit date: 1976    Years since quitting: 45.7  . Smokeless tobacco: Never Used  Vaping Use  . Vaping Use: Never used  Substance and Sexual Activity  . Alcohol use: Not Currently  . Drug use: Never  . Sexual activity: Not on file  Other Topics Concern  . Not on file  Social History Narrative  . Not on file   Social Determinants of Health   Financial Resource Strain: Low Risk   . Difficulty of Paying Living Expenses: Not hard at all  Food Insecurity: No Food Insecurity  . Worried About Programme researcher, broadcasting/film/video in the Last Year: Never true  . Ran Out of Food in the Last Year: Never true  Transportation Needs: No Transportation Needs  . Lack of Transportation (Medical): No  . Lack of Transportation (Non-Medical): No  Physical Activity: Insufficiently Active  . Days of Exercise per Week: 5 days  . Minutes of Exercise per  Session: 10 min  Stress: No Stress Concern Present  . Feeling of Stress : Not at all  Social Connections: Moderately Isolated  . Frequency of Communication with Friends and Family: More than three times a week  . Frequency of Social Gatherings with Friends and Family: More than three times a week  . Attends Religious Services: More than 4 times per year  . Active Member of Clubs or Organizations: No  . Attends Banker Meetings: Never  . Marital Status: Divorced     Family History: The patient's family history includes Heart disease in his brother, father, and mother; Hypertension in his father and mother; Stroke in his mother.  ROS:   Please see the history of present illness.    Notes R leg  sciatica- improved with heating bad. All other systems reviewed and are negative.  EKGs/Labs/Other Studies Reviewed:    The following studies were reviewed today:  EKG:  EKG is ordered today.  The ekg ordered today demonstrates  Sinus rhythm with frequent PACs.  Recent Labs: No results found for requested labs within last 8760 hours.  Recent Lipid Panel No results found for: CHOL, TRIG, HDL, CHOLHDL, VLDL, LDLCALC, LDLDIRECT  Physical Exam:    VS:  BP 130/68   Pulse 73   Ht 5\' 9"  (1.753 m)   Wt 184 lb (83.5 kg)   SpO2 97%   BMI 27.17 kg/m     Wt Readings from Last 3 Encounters:  05/16/20 184 lb (83.5 kg)  04/23/20 186 lb (84.4 kg)  02/18/20 181 lb (82.1 kg)     GEN: Well nourished, well developed in no acute distress HEENT: Normal NECK: No JVD; No carotid bruits LYMPHATICS: No lymphadenopathy CARDIAC: RRR, no murmurs, rubs, gallops RESPIRATORY:  Clear to auscultation without rales, wheezing or rhonchi  ABDOMEN: Soft, non-tender, non-distended MUSCULOSKELETAL:  No edema; No deformity  SKIN: Warm and dry NEUROLOGIC:  Alert and oriented x 3 PSYCHIATRIC:  Normal affect   ASSESSMENT:    1. Ischemic stroke (HCC)   2. Essential hypertension    PLAN:    In order of problems listed above:  1. History of stroke - requesting [facility] Records (LCP and stroke discharge summary from 2018; Cardiology visit last note:  Dr. 2019 [phone]) 2. Hyperlipidemia - will check lipids 3. Hypertension Continue HCTZ, lostartan, and norvasc 4.  PACs - asymptomatic, monitor  Can see in 11 months unless new sx occur  Medication Adjustments/Labs and Tests Ordered: Current medicines are reviewed at length with the patient today.  Concerns regarding medicines are outlined above.  No orders of the defined types were placed in this encounter.  No orders of the defined types were placed in this encounter.   There are no Patient Instructions on file for  this visit.   Signed, - [phone], MD  05/16/2020 3:03 PM    [facility]

## 2020-05-16 NOTE — Patient Instructions (Signed)
Medication Instructions:  Your physician recommends that you continue on your current medications as directed. Please refer to the Current Medication list given to you today.  *If you need a refill on your cardiac medications before your next appointment, please call your pharmacy*   Lab Work: TODAY: LIPIDS  If you have labs (blood work) drawn today and your tests are completely normal, you will receive your results only by: Marland Kitchen MyChart Message (if you have MyChart) OR . A paper copy in the mail If you have any lab test that is abnormal or we need to change your treatment, we will call you to review the results.   Testing/Procedures: None   Follow-Up: At [facility], you and your health needs are our priority.  As part of our continuing mission to provide you with exceptional heart care, we have created designated Provider Care Teams.  These Care Teams include your primary Cardiologist (physician) and Advanced Practice Providers (APPs -  Physician Assistants and Nurse Practitioners) who all work together to provide you with the care you need, when you need it.  We recommend signing up for the patient portal called "MyChart".  Sign up information is provided on this After Visit Summary.  MyChart is used to connect with patients for Virtual Visits (Telemedicine).  Patients are able to view lab/test results, encounter notes, upcoming appointments, etc.  Non-urgent messages can be sent to your provider as well.   To learn more about what you can do with MyChart, go to [email or web address].    Your next appointment:   11 month(s)  The format for your next appointment:   In Person  Provider:   Riley Lam, MD   Other Instructions None

## 2020-05-17 LAB — LIPID PANEL
Chol/HDL Ratio: 2.3 ratio (ref 0.0–5.0)
Cholesterol, Total: 113 mg/dL (ref 100–199)
HDL: 49 mg/dL (ref >39)
LDL Chol Calc (NIH): 52 mg/dL (ref 0–99)
Triglycerides: 48 mg/dL (ref 0–149)
VLDL Cholesterol Cal: 12 mg/dL (ref 5–40)

## 2020-05-19 ENCOUNTER — Telehealth: Payer: Self-pay | Admitting: Internal Medicine

## 2020-05-19 ENCOUNTER — Telehealth: Payer: Self-pay

## 2020-05-19 ENCOUNTER — Encounter: Payer: Self-pay | Admitting: *Deleted

## 2020-05-19 NOTE — Telephone Encounter (Signed)
Medical records requested from [facility] and [facility]. 05/19/20 vlm

## 2020-05-19 NOTE — Telephone Encounter (Signed)
Pt is requesting an appt with a neurologist for his medical card to be signed, so he can go back to work.

## 2020-05-21 NOTE — Telephone Encounter (Signed)
Please advise 

## 2020-05-21 NOTE — Telephone Encounter (Signed)
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

## 2020-05-21 NOTE — Telephone Encounter (Signed)
Please call and get more info.  Albert [name]. [name], MD 05/21/2020 10:52 AM

## 2020-05-23 ENCOUNTER — Ambulatory Visit: Payer: Medicare (Managed Care) | Admitting: Internal Medicine

## 2020-05-23 ENCOUNTER — Other Ambulatory Visit: Payer: Self-pay | Admitting: *Deleted

## 2020-05-23 DIAGNOSIS — Z8673 Personal history of transient ischemic attack (TIA), and cerebral infarction without residual deficits: Secondary | ICD-10-CM

## 2020-05-23 NOTE — Telephone Encounter (Signed)
Ok with me. Please place any necessary orders. 

## 2020-05-23 NOTE — Telephone Encounter (Signed)
Neurology referral placed

## 2020-06-03 ENCOUNTER — Ambulatory Visit: Payer: Medicare (Managed Care) | Admitting: Internal Medicine

## 2020-10-21 ENCOUNTER — Ambulatory Visit: Payer: Medicare (Managed Care) | Admitting: Family Medicine

## 2020-10-28 ENCOUNTER — Encounter: Payer: Self-pay | Admitting: Physician Assistant

## 2020-10-28 ENCOUNTER — Ambulatory Visit: Payer: Medicare (Managed Care) | Admitting: Physician Assistant

## 2020-10-28 ENCOUNTER — Other Ambulatory Visit: Payer: Self-pay

## 2020-10-28 ENCOUNTER — Ambulatory Visit: Payer: Medicare (Managed Care) | Admitting: Family Medicine

## 2020-10-28 VITALS — BP 132/70 | HR 81 | Temp 97.7°F | Ht 69.0 in | Wt 185.0 lb

## 2020-10-28 DIAGNOSIS — M5442 Lumbago with sciatica, left side: Secondary | ICD-10-CM | POA: Diagnosis not present

## 2020-10-28 DIAGNOSIS — M5441 Lumbago with sciatica, right side: Secondary | ICD-10-CM

## 2020-10-28 DIAGNOSIS — M25561 Pain in right knee: Secondary | ICD-10-CM

## 2020-10-28 DIAGNOSIS — K5900 Constipation, unspecified: Secondary | ICD-10-CM

## 2020-10-28 DIAGNOSIS — M25562 Pain in left knee: Secondary | ICD-10-CM

## 2020-10-28 DIAGNOSIS — G8929 Other chronic pain: Secondary | ICD-10-CM

## 2020-10-28 NOTE — Progress Notes (Signed)
Subjective:   I, Philbert Riser, LAT, ATC acting as a scribe for Clementeen Graham, MD.  I'm seeing this patient as a consultation for [employer], PA-C. Note will be routed back to referring provider/PCP.  CC: Chronic bilateral low back pain and bilateral knee weakness  HPI: Pt is a 75 y/o male c/o bilat low back pain and bilateral knee weakness. Pt reports LBPn has worsened over the last 4 months. Pain began last summer. Pt has fallen twice due to weakness in bilat knees. Pt located LBPn to lower back that radiates into bilateral hamstrings. Patient is a Naval architect. Pain is worse at night and in the mornings. Feels most comfortable in lumbar flexed position. Feels like left leg is heavy over the past 2 weeks. Does not use anything for pain management.  In general he wants to avoid medications if possible.  He is already interested in aquatic physical therapy in [place].  Also mentions numbness in both hands for one month. Tingling is constant. Started out in his thumb and is now in all fingers.   Radiating pn: yes- into buttocks and down posterior aspect of thighs LE numbness/tingling: LE weakness: yes- bilat knees Aggravates: Rx tried: diclofenac- no longer providing relief  Dx imaging: 02/18/20 L-spine XR  Past medical history, Surgical history, Family history, Social history, Allergies, and medications have been entered into the medical record, reviewed.   Review of Systems: No new headache, visual changes, nausea, vomiting, diarrhea, constipation, dizziness, abdominal pain, skin rash, fevers, chills, night sweats, weight loss, swollen lymph nodes, body aches, joint swelling, muscle aches, chest pain, shortness of breath, mood changes, visual or auditory hallucinations.   Objective:    Vitals:   10/29/20 1256  BP: (!) 128/92  Pulse: 70  SpO2: 98%   General: Well Developed, well nourished, and in no acute distress.  Neuro/Psych: Alert and oriented x3, extra-ocular muscles  intact, able to move all 4 extremities, sensation grossly intact. Skin: Warm and dry, no rashes noted.  Respiratory: Not using accessory muscles, speaking in full sentences, trachea midline.  Cardiovascular: Pulses palpable, no extremity edema. Abdomen: Does not appear distended. MSK:  L-spine normal-appearing Nontender midline. Decreased lumbar motion to extension. Lower extremity strength reflexes and sensation are equal normal throughout bilateral extremities.  Hands bilaterally normal. Positive Tinel's carpal tunnel mild bilateral. Positive Phalen's test carpal tunnel bilaterally mild.. Grip strength intact.  Lab and Radiology Results EXAM: LUMBAR SPINE - COMPLETE 4+ VIEW  COMPARISON:  None.  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 Normal alignment. No fracture. SI joints symmetric and unremarkable.  IMPRESSION: Diffuse degenerative disc and facet disease. No acute bony abnormality.   Electronically Signed   By: Charlett Nose M.D.   On: 02/18/2020 19:41 I, Clementeen Graham, personally (independently) visualized and performed the interpretation of the images attached in this note.   Impression and Recommendations:    Assessment and Plan: 75 y.o. male with low back pain with pain radiating to the posterior thighs bilaterally.  This is also associated with intermittent lower extremity weakness.  Concerning for lumbosacral radiculopathy.  X-ray of lumbar spine obtained in June 2021 shows significant lumbar degeneration.  Patient is a great candidate for physical therapy.  Plan for referral to PT.  He is already expressed interest in aquatic PT so refer to that.  Discussed medication options.  We discussed prednisone and gabapentin and he declines both for now which I think is not unreasonable.  Certainly can prescribe  these medicines in the future if needed.  Additionally patient has evidence of mild carpal tunnel syndrome  bilaterally.  Recommend night splints bilaterally.  Recheck in 6 weeks.  Return sooner if needed.  Precautions reviewed.  PDMP not reviewed this encounter. Orders Placed This Encounter  Procedures  . Ambulatory referral to Physical Therapy    Referral Priority:   Routine    Referral Type:   Physical Medicine    Referral Reason:   Specialty Services Required    Requested Specialty:   Physical Therapy    Number of Visits Requested:   1   No orders of the defined types were placed in this encounter.   Discussed warning signs or symptoms. Please see discharge instructions. Patient expresses understanding.   The above documentation has been reviewed and is accurate and complete Clementeen Graham, M.D.

## 2020-10-28 NOTE — Progress Notes (Signed)
Albert [name] is a 75 y.o. male here for a new problem.  I acted as a Neurosurgeon for [employer], PA-C Corky Mull, LPN   History of Present Illness:   Chief Complaint  Patient presents with  . Back Pain    HPI   Back pain Pt c/o low back pain radiating into buttocks down back of thighs. He has history of back pain and has been seen in the past by PCP as recently as last fall. Had lumbar xray that showed diffuse degenerative disk. His symptoms have worsened over the past 4 months. They were well controlled with oral diclofenac but not they are not. He is now experiencing  weakness in his knees. Has actually fallen twice due to knee weakness. Last fall was a week and a half ago, did not head head/LOC. He has a new patient appointment scheduled with a chiropractor on Thursday for evaluation of this.  LLQ pain Has had LLQ pain with eating sometimes. This started about 4 months ago. Has constipation. Will use hot water and has tried prune juice to treat this without improvement. Was supposed to have a colonoscopy but it had to be canceled due to insurance. Denies: rectal bleeding, nausea, vomiting, severe pain. Pain has actually improved with time.  Regarding both above issues, denies dysuria, hematuria, incontinence.  Past Medical History:  Diagnosis Date  . Dyslipidemia 08/31/2018  . Essential hypertension 08/31/2018  . History of stroke 08/31/2018  . Hypertension   . Inguinal hernia 08/31/2018  . Stroke [facility])      Social History   Tobacco Use  . Smoking status: Former Smoker    Quit date: 1976    Years since quitting: 46.1  . Smokeless tobacco: Never Used  Vaping Use  . Vaping Use: Never used  Substance Use Topics  . Alcohol use: Not Currently  . Drug use: Never    History reviewed. No pertinent surgical history.  Family History  Problem Relation Age of Onset  . Heart disease Mother   . Hypertension Mother   . Stroke Mother   . Heart disease Father   .  Hypertension Father   . Heart disease Brother     Allergies  Allergen Reactions  . Accupril [Quinapril Hcl] Swelling  . Shellfish Allergy   . Aspirin Nausea Only    Current Medications:   Current Outpatient Medications:  .  amLODipine (NORVASC) 10 MG tablet, Take by mouth daily., Disp: , Rfl:  .  atorvastatin (LIPITOR) 10 MG tablet, Take 10 mg by mouth daily., Disp: , Rfl:  .  diclofenac (VOLTAREN) 75 MG EC tablet, Take 1 tablet (75 mg total) by mouth 2 (two) times daily. (Patient taking differently: Take 75 mg by mouth as needed.), Disp: 60 tablet, Rfl: 5 .  hydrochlorothiazide (MICROZIDE) 12.5 MG capsule, Take 12.5 mg by mouth daily., Disp: , Rfl:  .  losartan (COZAAR) 50 MG tablet, Take 100 mg by mouth daily., Disp: , Rfl:    Review of Systems:   ROS  Negative unless otherwise specified per HPI.  Vitals:   Vitals:   10/28/20 1537  BP: 132/70  Pulse: 81  Temp: 97.7 F (36.5 C)  TempSrc: Temporal  SpO2: 98%  Weight: 185 lb (83.9 kg)  Height: 5\' 9"  (1.753 m)     Body mass index is 27.32 kg/m.  Physical Exam:   Physical Exam Vitals and nursing note reviewed.  Constitutional:      General: He is not in acute distress.  Appearance: He is well-developed. He is not ill-appearing, toxic-appearing or sickly-appearing.  Cardiovascular:     Rate and Rhythm: Normal rate and regular rhythm.     Pulses: Normal pulses.     Heart sounds: Normal heart sounds, S1 normal and S2 normal.     Comments: No LE edema Pulmonary:     Effort: Pulmonary effort is normal.     Breath sounds: Normal breath sounds.  Musculoskeletal:     Comments: No decreased ROM 2/2 pain with flexion/extension, lateral side bends, or rotation. Reproducible tenderness with deep palpation to bilateral paraspinal muscles. No bony tenderness. No evidence of erythema, rash or ecchymosis.   Knee exam with normal ROM, no TTP or swelling  Skin:    General: Skin is warm, dry and intact.  Neurological:      Mental Status: He is alert.     GCS: GCS eye subscore is 4. GCS verbal subscore is 5. GCS motor subscore is 6.  Psychiatric:        Mood and Affect: Mood and affect normal.        Speech: Speech normal.        Behavior: Behavior normal. Behavior is cooperative.     Assessment and Plan:   Albert [name] was seen today for back pain.  Diagnoses and all orders for this visit:  Chronic bilateral low back pain with bilateral sciatica; Acute pain of both knees No red flags on exam but he has had two falls since last in our office due to knee weakness. We discussed further work-up -- I recommended postponement/cancellation of chiropractor appointment and instead, start with evaluation with Dr. Denyse Amass at [facility]. He is agreeable to this and we secured appointment for tomorrow. Appreciate coordination of care. He declines any treatment or imaging at this time -- will defer to Dr. Zollie Pee team.  Constipation Suspect this is cause of symptoms. Discussed starting miralax 1 scoop daily in beverage of choice. Push fluids and fiber. Handout provided. Follow-up with [place] if symptoms worsen/persist.  CMA or LPN served as scribe during this visit. History, Physical, and Plan performed by medical provider. The above documentation has been reviewed and is accurate and complete.   Jarold Motto, PA-C

## 2020-10-28 NOTE — Patient Instructions (Addendum)
It was great to see you!  A referral has been placed for you to see Dr. Clementeen Graham with [facility]. See appointment time below.  I recommend canceling the chiropractor visit for now.  Start miralax (generic is fine) -- 1 scoop in beverage of choice daily until you are having regular bowel movements.   Take care,  Jarold Motto PA-C

## 2020-10-29 ENCOUNTER — Other Ambulatory Visit: Payer: Self-pay

## 2020-10-29 ENCOUNTER — Ambulatory Visit: Payer: Medicare (Managed Care) | Admitting: Family Medicine

## 2020-10-29 ENCOUNTER — Encounter: Payer: Self-pay | Admitting: Family Medicine

## 2020-10-29 VITALS — BP 128/92 | HR 70 | Ht 69.0 in | Wt 183.0 lb

## 2020-10-29 DIAGNOSIS — G5603 Carpal tunnel syndrome, bilateral upper limbs: Secondary | ICD-10-CM | POA: Insufficient documentation

## 2020-10-29 DIAGNOSIS — M545 Low back pain, unspecified: Secondary | ICD-10-CM | POA: Diagnosis not present

## 2020-10-29 DIAGNOSIS — M5416 Radiculopathy, lumbar region: Secondary | ICD-10-CM | POA: Diagnosis not present

## 2020-10-29 NOTE — Patient Instructions (Signed)
Thank you for coming in today.  I've referred you to Physical Therapy.  Let us know if you don't hear from them in one week.  Let me know if you need medicine but I agree on hold off on it for know.   Recheck in about 6 weeks.   Next step if needed is MRI.  Try a wrist brace at night.  I think you have carpal tunnel syndrome.Marland Kitchen

## 2020-11-03 ENCOUNTER — Telehealth: Payer: Self-pay

## 2020-11-03 NOTE — Telephone Encounter (Signed)
Patient called in stating that he was here for a visit recently and is states the medicine has not helped and the pain has not let up.

## 2020-11-03 NOTE — Telephone Encounter (Signed)
Sending to pcp.  Tiffiany Beadles, MD [place] [facility]   

## 2020-11-04 NOTE — Telephone Encounter (Signed)
Looks like se saw Sam then Dr Denyse Amass for this. Recommend he contact the sports medicine office.  Katina Degree. [name], MD 11/04/2020 8:06 AM

## 2020-11-04 NOTE — Telephone Encounter (Signed)
Patient is aware or Dr.Parkers comments and states that he seen sports medicine last week and he will give them a call regarding the pain.

## 2020-11-06 ENCOUNTER — Telehealth: Payer: Self-pay | Admitting: Family Medicine

## 2020-11-06 NOTE — Telephone Encounter (Signed)
Pt called, has not been able to start aquatic therapy as his L sided stomach pain is really bothering him. Pt thinks this is a GI issue, advised him to contact his PCP as he does not have a GI provider.  Just FYI, therapy delayed as the patient the stomach issue is bothering her most at this time.

## 2020-11-07 ENCOUNTER — Encounter: Payer: Self-pay | Admitting: Physician Assistant

## 2020-11-07 ENCOUNTER — Other Ambulatory Visit: Payer: Self-pay

## 2020-11-07 ENCOUNTER — Ambulatory Visit: Payer: Medicare (Managed Care) | Admitting: Physician Assistant

## 2020-11-07 VITALS — BP 122/70 | HR 66 | Temp 98.2°F | Ht 69.0 in | Wt 177.5 lb

## 2020-11-07 DIAGNOSIS — R1084 Generalized abdominal pain: Secondary | ICD-10-CM

## 2020-11-07 LAB — POC URINALSYSI DIPSTICK (AUTOMATED)
Bilirubin, UA: NEGATIVE
Blood, UA: NEGATIVE
Glucose, UA: NEGATIVE
Ketones, UA: NEGATIVE
Leukocytes, UA: NEGATIVE
Nitrite, UA: NEGATIVE
Protein, UA: NEGATIVE
Spec Grav, UA: 1.01 (ref 1.010–1.025)
Urobilinogen, UA: 0.2 E.U./dL
pH, UA: 7 (ref 5.0–8.0)

## 2020-11-07 LAB — COMPREHENSIVE METABOLIC PANEL
ALT: 20 U/L (ref 0–53)
AST: 26 U/L (ref 0–37)
Albumin: 3.9 g/dL (ref 3.5–5.2)
Alkaline Phosphatase: 63 U/L (ref 39–117)
BUN: 15 mg/dL (ref 6–23)
CO2: 29 mEq/L (ref 19–32)
Calcium: 9 mg/dL (ref 8.4–10.5)
Chloride: 99 mEq/L (ref 96–112)
Creatinine, Ser: 1.65 mg/dL — ABNORMAL HIGH (ref 0.40–1.50)
GFR: 40.6 mL/min — ABNORMAL LOW (ref >60.00)
Glucose, Bld: 108 mg/dL — ABNORMAL HIGH (ref 70–99)
Potassium: 4.1 mEq/L (ref 3.5–5.1)
Sodium: 136 mEq/L (ref 135–145)
Total Bilirubin: 0.8 mg/dL (ref 0.2–1.2)
Total Protein: 7.1 g/dL (ref 6.0–8.3)

## 2020-11-07 LAB — CBC WITH DIFFERENTIAL/PLATELET
Basophils Absolute: 0 10*3/uL (ref 0.0–0.1)
Basophils Relative: 0.3 % (ref 0.0–3.0)
Eosinophils Absolute: 0.2 10*3/uL (ref 0.0–0.7)
Eosinophils Relative: 4.7 % (ref 0.0–5.0)
HCT: 35.8 % — ABNORMAL LOW (ref 39.0–52.0)
Hemoglobin: 12.4 g/dL — ABNORMAL LOW (ref 13.0–17.0)
Lymphocytes Relative: 22.6 % (ref 12.0–46.0)
Lymphs Abs: 1 10*3/uL (ref 0.7–4.0)
MCHC: 34.8 g/dL (ref 30.0–36.0)
MCV: 92 fl (ref 78.0–100.0)
Monocytes Absolute: 0.6 10*3/uL (ref 0.1–1.0)
Monocytes Relative: 14.3 % — ABNORMAL HIGH (ref 3.0–12.0)
Neutro Abs: 2.5 10*3/uL (ref 1.4–7.7)
Neutrophils Relative %: 58.1 % (ref 43.0–77.0)
Platelets: 163 10*3/uL (ref 150.0–400.0)
RBC: 3.89 Mil/uL — ABNORMAL LOW (ref 4.22–5.81)
RDW: 13.3 % (ref 11.5–15.5)
WBC: 4.3 10*3/uL (ref 4.0–10.5)

## 2020-11-07 LAB — LIPASE: Lipase: 8 U/L — ABNORMAL LOW (ref 11.0–59.0)

## 2020-11-07 NOTE — Progress Notes (Signed)
Albert [name] is a 75 y.o. male here for a follow up of a pre-existing problem.  I acted as a Neurosurgeon for [employer], PA-C Corky Mull, LPN   History of Present Illness:   Chief Complaint  Patient presents with  . Abdominal Pain    HPI    Abdominal pain Patient was seen by me on 10/28/20 and we discussed his LLQ pain. See that note for more details.  Since seeing me, he c/o increase in abd pain LUQ and LLQ when eating. Pt has not been eating due to pain. Last solid meal was Sunday night. Monday he woke up in excruciating 10/10 pain. Has only had liquids since.   He has baseline constipation and was told to start miralax by me at our last visit. He took it twice without results. His last BM was 2 days ago, required an enema.  Denies: rectal bleeding, dysuria, hematuria, incontinence.  Was supposed to have a colonoscopy but it had to be canceled due to insurance. He has been told by his naturopathic doctor that he has a leaky gut.  Wt Readings from Last 4 Encounters:  11/07/20 177 lb 8 oz (80.5 kg)  10/29/20 183 lb (83 kg)  10/28/20 185 lb (83.9 kg)  05/16/20 184 lb (83.5 kg)      Past Medical History:  Diagnosis Date  . Dyslipidemia 08/31/2018  . Essential hypertension 08/31/2018  . History of stroke 08/31/2018  . Hypertension   . Inguinal hernia 08/31/2018  . Stroke [facility])      Social History   Tobacco Use  . Smoking status: Former Smoker    Quit date: 1976    Years since quitting: 46.2  . Smokeless tobacco: Never Used  Vaping Use  . Vaping Use: Never used  Substance Use Topics  . Alcohol use: Not Currently  . Drug use: Never    History reviewed. No pertinent surgical history.  Family History  Problem Relation Age of Onset  . Heart disease Mother   . Hypertension Mother   . Stroke Mother   . Heart disease Father   . Hypertension Father   . Heart disease Brother     Allergies  Allergen Reactions  . Accupril [Quinapril Hcl] Swelling  .  Shellfish Allergy   . Aspirin Nausea Only    Current Medications:   Current Outpatient Medications:  .  amLODipine (NORVASC) 10 MG tablet, Take by mouth daily., Disp: , Rfl:  .  atorvastatin (LIPITOR) 20 MG tablet, Take 20 mg by mouth daily., Disp: , Rfl:  .  diclofenac (VOLTAREN) 75 MG EC tablet, Take 1 tablet (75 mg total) by mouth 2 (two) times daily. (Patient taking differently: Take 75 mg by mouth as needed.), Disp: 60 tablet, Rfl: 5 .  hydrochlorothiazide (HYDRODIURIL) 25 MG tablet, Take 25 mg by mouth daily., Disp: , Rfl:  .  losartan (COZAAR) 50 MG tablet, Take 100 mg by mouth daily., Disp: , Rfl:    Review of Systems:   ROS Negative unless otherwise specified per HPI.  Vitals:   Vitals:   11/07/20 1017  BP: 122/70  Pulse: 66  Temp: 98.2 F (36.8 C)  TempSrc: Temporal  SpO2: 99%  Weight: 177 lb 8 oz (80.5 kg)  Height: 5\' 9"  (1.753 m)     Body mass index is 26.21 kg/m.  Physical Exam:   Physical Exam Vitals and nursing note reviewed.  Constitutional:      General: He is not in acute distress.  Appearance: He is well-developed. He is not ill-appearing, toxic-appearing or sickly-appearing.  Cardiovascular:     Rate and Rhythm: Normal rate and regular rhythm.     Pulses: Normal pulses.     Heart sounds: Normal heart sounds, S1 normal and S2 normal.     Comments: No LE edema Pulmonary:     Effort: Pulmonary effort is normal.     Breath sounds: Normal breath sounds.  Abdominal:     General: Abdomen is flat. Bowel sounds are normal.     Palpations: Abdomen is soft.     Tenderness: There is abdominal tenderness in the left upper quadrant and left lower quadrant.     Comments: Mild LUQ/LLQ tenderness  Skin:    General: Skin is warm, dry and intact.  Neurological:     Mental Status: He is alert.     GCS: GCS eye subscore is 4. GCS verbal subscore is 5. GCS motor subscore is 6.  Psychiatric:        Mood and Affect: Mood and affect normal.        Speech:  Speech normal.        Behavior: Behavior normal. Behavior is cooperative.    Results for orders placed or performed in visit on 11/07/20  POCT Urinalysis Dipstick (Automated)  Result Value Ref Range   Color, UA yellow    Clarity, UA clear    Glucose, UA Negative Negative   Bilirubin, UA negative    Ketones, UA negative    Spec Grav, UA 1.010 1.010 - 1.025   Blood, UA negative    pH, UA 7.0 5.0 - 8.0   Protein, UA Negative Negative   Urobilinogen, UA 0.2 0.2 or 1.0 E.U./dL   Nitrite, UA negative    Leukocytes, UA Negative Negative     Assessment and Plan:   Dorell was seen today for abdominal pain.  Diagnoses and all orders for this visit:  Generalized abdominal pain -     POCT Urinalysis Dipstick (Automated) -     CBC with Differential/Platelet -     Comprehensive metabolic panel -     Lipase -     CT Abdomen Pelvis W Contrast; Future -     Urine Culture   Unclear etiology. DDx includes: constipation, IBS, mass, pancreatitis, PUD, among others. Update blood work today. Stat CT abd/pel given inability to eat solids, severe intermittent pain and weight loss. Further work-up/intervention based on results and clinical symptoms. Low threshold to go to ER if any worsening symptoms.  CMA or LPN served as scribe during this visit. History, Physical, and Plan performed by medical provider. The above documentation has been reviewed and is accurate and complete.  Jarold Motto, PA-C

## 2020-11-07 NOTE — Telephone Encounter (Signed)
His primary care provider is at [facility].  He should contact that office to schedule an appointment to discuss his stomach.

## 2020-11-07 NOTE — Patient Instructions (Addendum)
It was great to see you!  Blood work and urine testing today. We will be in touch with your results.  I am trying to schedule your CT scan. You will be contacted about scheduling this.  In the meantime, if any worsening pain, you need to go to the ER, see highlighted section below.  Abdominal Pain, Adult Many things can cause belly (abdominal) pain. Most times, belly pain is not dangerous. Many cases of belly pain can be watched and treated at home. Sometimes, though, belly pain is serious. Your doctor will try to find the cause of your belly pain. Follow these instructions at home: Medicines  Take over-the-counter and prescription medicines only as told by your doctor.  Do not take medicines that help you poop (laxatives) unless told by your doctor. General instructions  Watch your belly pain for any changes.  Drink enough fluid to keep your pee (urine) pale yellow.  Keep all follow-up visits as told by your doctor. This is important.   Contact a doctor if:  Your belly pain changes or gets worse.  You are not hungry, or you lose weight without trying.  You are having trouble pooping (constipated) or have watery poop (diarrhea) for more than 2-3 days.  You have pain when you pee or poop.  Your belly pain wakes you up at night.  Your pain gets worse with meals, after eating, or with certain foods.  You are vomiting and cannot keep anything down.  You have a fever.  You have blood in your pee. Get help right away if:  Your pain does not go away as soon as your doctor says it should.  You cannot stop vomiting.  Your pain is only in areas of your belly, such as the right side or the left lower part of the belly.  You have bloody or black poop, or poop that looks like tar.  You have very bad pain, cramping, or bloating in your belly.  You have signs of not having enough fluid or water in your body (dehydration), such as: ? Dark pee, very little pee, or no  pee. ? Cracked lips. ? Dry mouth. ? Sunken eyes. ? Sleepiness. ? Weakness.  You have trouble breathing or chest pain. Summary  Many cases of belly pain can be watched and treated at home.  Watch your belly pain for any changes.  Take over-the-counter and prescription medicines only as told by your doctor.  Contact a doctor if your belly pain changes or gets worse.  Get help right away if you have very bad pain, cramping, or bloating in your belly. This information is not intended to replace advice given to you by your health care provider. Make sure you discuss any questions you have with your health care provider. Document Revised: 01/01/2019 Document Reviewed: 01/01/2019 Elsevier Patient Education  2021 [employer].   Take care,  Jarold Motto PA-C

## 2020-11-08 LAB — URINE CULTURE
MICRO NUMBER:: 11608668
Result:: NO GROWTH
SPECIMEN QUALITY:: ADEQUATE

## 2020-11-10 ENCOUNTER — Other Ambulatory Visit: Payer: Self-pay | Admitting: Physician Assistant

## 2020-11-10 DIAGNOSIS — R1084 Generalized abdominal pain: Secondary | ICD-10-CM

## 2020-11-12 ENCOUNTER — Telehealth: Payer: Self-pay | Admitting: *Deleted

## 2020-11-12 NOTE — Telephone Encounter (Signed)
Spoke with Dr Cloretta Ned at [facility]  Dr Cloretta Ned stared her office will do patient blood work and CT, Patient is at [facility] with abdominal pain today.

## 2020-11-13 ENCOUNTER — Other Ambulatory Visit: Payer: Medicare (Managed Care)

## 2020-11-25 ENCOUNTER — Telehealth: Payer: Self-pay

## 2020-11-25 NOTE — Telephone Encounter (Signed)
[facility] is needing all the office notes faxed over for the referral that was sent for pt   Fax: [phone]

## 2020-11-27 NOTE — Telephone Encounter (Signed)
Office notes were faxed on 11/13/20 with the referral- I am faxing it again today

## 2020-11-28 ENCOUNTER — Telehealth: Payer: Self-pay

## 2020-11-28 NOTE — Telephone Encounter (Signed)
[facility] states they still have not received the notes and are asking for them to be refaxed

## 2020-11-28 NOTE — Telephone Encounter (Signed)
err

## 2020-11-28 NOTE — Telephone Encounter (Signed)
I have faxed again for the 4th time and verified the fax #

## 2020-12-09 ENCOUNTER — Ambulatory Visit: Payer: Medicare (Managed Care) | Admitting: Family Medicine

## 2021-03-19 ENCOUNTER — Ambulatory Visit: Payer: Medicare (Managed Care)

## 2021-04-15 ENCOUNTER — Other Ambulatory Visit: Payer: Self-pay

## 2021-04-15 ENCOUNTER — Ambulatory Visit: Payer: Medicare (Managed Care) | Attending: Orthopedic Surgery | Admitting: Physical Therapy

## 2021-04-15 ENCOUNTER — Encounter: Payer: Self-pay | Admitting: Physical Therapy

## 2021-04-15 DIAGNOSIS — G8929 Other chronic pain: Secondary | ICD-10-CM | POA: Insufficient documentation

## 2021-04-15 DIAGNOSIS — M542 Cervicalgia: Secondary | ICD-10-CM | POA: Diagnosis present

## 2021-04-15 DIAGNOSIS — R293 Abnormal posture: Secondary | ICD-10-CM | POA: Diagnosis present

## 2021-04-15 DIAGNOSIS — M545 Low back pain, unspecified: Secondary | ICD-10-CM | POA: Insufficient documentation

## 2021-04-15 DIAGNOSIS — M6281 Muscle weakness (generalized): Secondary | ICD-10-CM | POA: Insufficient documentation

## 2021-04-15 NOTE — Patient Instructions (Signed)
Access Code: [email or web address] URL: [email or web address] Date: 04/15/2021 Prepared by: Rosana Hoes  Exercises Supine Piriformis Stretch with Foot on Ground - 1-2 x daily - 7 x weekly - 2 reps - 30 hold Supine Lower Trunk Rotation - 1 x daily - 7 x weekly - 5 reps - 10 hold Seated Hamstring Stretch - 1-2 x daily - 7 x weekly - 2 reps - 30 hold Clamshell - 1 x daily - 7 x weekly - 2 sets - 15 reps Bridge - 1 x daily - 7 x weekly - 2 sets - 10 reps - 3 hold Banded Row - 1 x daily - 7 x weekly - 3 sets - 10 reps

## 2021-04-16 NOTE — Therapy (Signed)
[facility] Outpatient Rehabilitation [facility] [address] [place], [place], [address] Phone: [phone]   Fax:  [phone]  Physical Therapy Evaluation  Patient Details  Name: Albert [name] MRN: [id number] Date of Birth: October 13, 1945 Referring Provider (PT): Sheral Apley, MD   Encounter Date: 04/15/2021   PT End of Session - 04/15/21 1341     Visit Number 1    Number of Visits 8    Date for PT Re-Evaluation 06/10/21    Authorization Type [employer] MCR    Progress Note Due on Visit 10    PT Start Time 1350    PT Stop Time 1435    PT Time Calculation (min) 45 min    Activity Tolerance Patient tolerated treatment well    Behavior During Therapy [facility] for tasks assessed/performed             Past Medical History:  Diagnosis Date   Dyslipidemia 08/31/2018   Essential hypertension 08/31/2018   History of stroke 08/31/2018   Hypertension    Inguinal hernia 08/31/2018   Stroke [facility])     History reviewed. No pertinent surgical history.  There were no vitals filed for this visit.    Subjective Assessment - 04/15/21 1348     Subjective Patient reports he has been having problems with his pelvis and sciatic nerves, left side has been worse. 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 He has to stop multiple times while driving to/from [place]. States over the past couple months it has improved a little bit with heating pads, water epson salt solutions, and nerve capsules. Patient also reports his arms and hands have been numb and the doctor told him it was coming from his neck. Pain/stiffness is constant but is aggravated with lifting, jogging or exercise. States he doesn't have any trouble sleeping.    Pertinent History CVA in 2018 affecting left side    Limitations Lifting;Walking;House hold activities    How long can you sit comfortably? 45 minutes    How long can you walk comfortably? 30-40 yards     Patient Stated Goals Patient will improve posture, seated tolerance, and exercise ability    Currently in Pain? Yes    Pain Score 4     Pain Location Back    Pain Orientation Lower    Pain Descriptors / Indicators Pressure    Pain Type Chronic pain    Pain Radiating Towards bilateral numbness/pain into gluteals and back of legs    Pain Onset More than a month ago    Pain Frequency Constant    Aggravating Factors  Sitting or walking extended periods, lifting, exercise    Pain Relieving Factors Heating, epson salt    Multiple Pain Sites Yes    Pain Score 0    Pain Location Neck    Pain Orientation Right;Left    Pain Descriptors / Indicators Sharp    Pain Type Chronic pain    Pain Radiating Towards numbness bilateral arms and hands    Pain Onset More than a month ago    Pain Frequency Constant    Aggravating Factors  feels like being in cold temperature makes numbness worse    Pain Relieving Factors Heat/warm water                [facility] PT Assessment - 04/16/21 0001       Assessment   Medical Diagnosis cervicalgia with bilater radiculopathy and  lumbago with bilateral sciatica    Referring Provider (PT) Sheral ApleyMurphy, Timothy D, MD    Onset Date/Surgical Date --   worsening over past 8-9 months   Hand Dominance Right    Prior Therapy None      Precautions   Precautions None      Restrictions   Weight Bearing Restrictions No      Balance Screen   Has the patient fallen in the past 6 months Yes    How many times? Has fallen a few times due to leg/trunk weakness and stiffness    Has the patient had a decrease in activity level because of a fear of falling?  No    Is the patient reluctant to leave their home because of a fear of falling?  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      Cognition   Overall Cognitive Status Within Functional Limits for tasks assessed       Observation/Other Assessments   Observations Patient appears in no apparent distress    Focus on Therapeutic Outcomes (FOTO)  38% functional status      Sensation   Light Touch Impaired by gross assessment    Additional Comments Patient reports sensation deficit bilateral distal UEs and hands with no apparent dermatomal pattern      Coordination   Gross Motor Movements are Fluid and Coordinated Yes      ROM / Strength   AROM / PROM / Strength AROM;PROM;Strength      AROM   Overall AROM Comments Cervical AROM not measured but performed and did not change BUE numbness symptoms    AROM Assessment Site Lumbar;Cervical    Lumbar Flexion 75% - hamstring tightness noted    Lumbar Extension 50%    Lumbar - Right Side Bend WFL    Lumbar - Left Side Bend WFL    Lumbar - Right Rotation 75%    Lumbar - Left Rotation 75%      PROM   Overall PROM Comments Hip PROM slightly limited secondary to muscular tightness      Strength   Overall Strength Comments Core and periscapular strength grossly 4-/5 MMT    Strength Assessment Site Shoulder;Hip;Knee    Right/Left Hip Right;Left    Right Hip Flexion 4/5    Right Hip Extension 3-/5    Right Hip ABduction 3-/5    Left Hip Flexion 4-/5    Left Hip Extension 3-/5    Left Hip ABduction 3-/5    Right/Left Knee Right;Left    Right Knee Flexion 5/5    Right Knee Extension 5/5    Left Knee Flexion 4/5    Left Knee Extension 4+/5      Flexibility   Soft Tissue Assessment /Muscle Length yes    Hamstrings Limited bilaterally, ~30 deg SLR    Quadriceps Limited bilaterally, ~90 deg prone knee bend    Piriformis Limited bilaterally      Palpation   Spinal mobility Not assessed    Palpation comment Non TTP      Special Tests   Other special tests Lumbar and cervical radicular testing grossly negative      Transfers   Transfers Independent with all Transfers                        Objective measurements completed on  examination: See above findings.       [facility] Adult PT Treatment/Exercise - 04/16/21 0001       Posture/Postural Control   Posture Comments Rounded shoulder and forward head posture, gross slouched seated posture,      Exercises   Exercises Neck;Lumbar      Neck Exercises: Theraband   Rows 10 reps;Green      Lumbar Exercises: Stretches   Passive Hamstring Stretch 2 reps;30 seconds    Passive Hamstring Stretch Limitations seated edge of chair    Lower Trunk Rotation 3 reps;10 seconds    Piriformis Stretch 2 reps;30 seconds    Piriformis Stretch Limitations supine      Lumbar Exercises: Supine   Bridge 10 reps;3 seconds      Lumbar Exercises: Sidelying   Clam 15 reps                    PT Education - 04/15/21 1340     Education Details Exam findings, POC, HEP    Person(s) Educated Patient    Methods Explanation;Demonstration;Tactile cues;Verbal cues;Handout    Comprehension Verbalized understanding;Returned demonstration;Verbal cues required;Tactile cues required;Need further instruction              PT Short Term Goals - 04/15/21 1341       PT SHORT TERM GOAL #1   Title Patient will be I with initial HEP to progress with PT    Time 4    Period Weeks    Status New    Target Date 05/13/21      PT SHORT TERM GOAL #2   Title PT will review FOTO with patient by 3rd visit to understand expected outcome    Time 3    Period Weeks    Status New    Target Date 05/06/21      PT SHORT TERM GOAL #3   Title Patient will be able to demonstrate appropriate seated posture without increase in posterior thigh pain/tightness to improve seated tolerance    Time 4    Period Weeks    Status New    Target Date 05/13/21               PT Long Term Goals - 04/15/21 1343       PT LONG TERM GOAL #1   Title Patient will be I with final HEP to maintain progress from PT    Time 8    Period Weeks    Status New    Target Date 06/10/21      PT LONG TERM GOAL  #2   Title Patient will report >/= 47% functional status on FOTO to indicate improved functional ability    Time 8    Period Weeks    Status New    Target Date 06/10/21      PT LONG TERM GOAL #3   Title Patient will be able to tolerate sitting >/= 1 hour in order to improve driving tractor trailors    Time 8    Period Weeks    Status New    Target Date 06/10/21      PT LONG TERM GOAL #4   Title Patient will exhibit >/= 4/5 MMT gluteal and core strength in order to improve ability to resume exercise and walking without limitation or increase in pain    Time 8    Period Weeks    Status New    Target Date 06/10/21  Plan - 04/15/21 1344     Clinical Impression Statement Patient presents to PT with report of chronic low back pain with radiating pain from bilateral gluteal region to posterior thighs, and bilateral UE/hand numbness with occasional neck pain. Today's session focused mainly on lumbar and leg symptoms. Patient exhibit significant flexibility deficits of the hips and hamstrings, with gluteal and core strength deficit, and postural impairements likely leading to his back/hip/thigh pain and poor seated tolerance. He did not exhibit any radicular symptoms with lumbar assessment, mainly compaints of hamstring tightness which recreated posterior thigh discomfort especially when attempting to demonstrate proper seated posture which causes him to assume a kyphotic (posteriorly pelvic tilted) position while seated. In regards to his neck and BUE numbness cervical motion and radicular testing did not change numbness in distal arms and hands so unclear the etiology of those symptoms especially main agg/ease factors being temperature changes. BUE symptoms could possibly be due to TOS or vascular issues. He does exhibit poor posture with rounded shoulders and forward head position so instructed on proper seated posture and provided exercises to improve flexibility and  posture. Patient would benefit from continued skilled PT to progress his mobility and strength in order to reduce pain and maximize functional ability.    Personal Factors and Comorbidities Age;Fitness;Time since onset of injury/illness/exacerbation;Comorbidity 1    Comorbidities History of CVA affecting left side    Examination-Activity Limitations Locomotion Level;Sit;Lift;Bend    Examination-Participation Restrictions Occupation;Driving;Community Activity;Shop;Yard Work    Conservation officer, historic buildings Evolving/Moderate complexity    Clinical Decision Making Moderate    Rehab Potential Fair    PT Frequency 1x / week    PT Duration 8 weeks    PT Treatment/Interventions ADLs/Self Care Home Management;Aquatic Therapy;Cryotherapy;Electrical Stimulation;Iontophoresis 4mg /ml Dexamethasone;Moist Heat;Traction;Ultrasound;Neuromuscular re-education;Balance training;Therapeutic exercise;Therapeutic activities;Functional mobility training;Stair training;Gait training;Patient/family education;Manual techniques;Dry needling;Passive range of motion;Taping;Vasopneumatic Device;Spinal Manipulations;Joint Manipulations    PT Next Visit Plan Review HEP and progress PRN, flexibility for hips and hamstrings, progress core and postural strength, further assess neck PRN    PT Home Exercise Plan [email or web address]    Consulted and Agree with Plan of Care Patient             Patient will benefit from skilled therapeutic intervention in order to improve the following deficits and impairments:  Decreased range of motion, Decreased activity tolerance, Pain, Postural dysfunction, Impaired flexibility, Decreased strength, Impaired sensation, Decreased balance  Visit Diagnosis: Chronic bilateral low back pain, unspecified whether sciatica present  Cervicalgia  Muscle weakness (generalized)  Abnormal posture     Problem List Patient Active Problem List   Diagnosis Date Noted   Carpal tunnel syndrome, bilateral  10/29/2020   Lumbar radiculopathy 10/29/2020   Lumbar pain 10/29/2020   Premature atrial contractions 05/16/2020   Essential hypertension 08/31/2018   Dyslipidemia 08/31/2018   History of stroke 08/31/2018   Inguinal hernia 08/31/2018    09/02/2018, PT, DPT, LAT, ATC 04/16/21  8:24 AM Phone: [phone] Fax: [phone]   [facility] Outpatient Rehabilitation [facility] [address] [place], [place], [place] Phone: [phone]   Fax:  [phone]  Name: Albert [name] MRN: [name] Date of Birth: [birth year]

## 2021-04-21 ENCOUNTER — Other Ambulatory Visit: Payer: Self-pay

## 2021-04-21 ENCOUNTER — Ambulatory Visit: Payer: Medicare (Managed Care) | Admitting: Physical Therapy

## 2021-04-21 ENCOUNTER — Encounter: Payer: Self-pay | Admitting: Physical Therapy

## 2021-04-21 DIAGNOSIS — M6281 Muscle weakness (generalized): Secondary | ICD-10-CM

## 2021-04-21 DIAGNOSIS — M542 Cervicalgia: Secondary | ICD-10-CM

## 2021-04-21 DIAGNOSIS — M545 Low back pain, unspecified: Secondary | ICD-10-CM

## 2021-04-21 DIAGNOSIS — G8929 Other chronic pain: Secondary | ICD-10-CM

## 2021-04-21 DIAGNOSIS — R293 Abnormal posture: Secondary | ICD-10-CM

## 2021-04-21 NOTE — Therapy (Signed)
[facility] Outpatient Rehabilitation [facility] [address] [place], [place], [address] Phone: [phone]   Fax:  [phone]  Physical Therapy Treatment  Patient Details  Name: Albert [name] MRN: [id number] Date of Birth: 1946-03-01 Referring Provider (PT): Sheral Apley, MD   Encounter Date: 04/21/2021   PT End of Session - 04/21/21 1709     Visit Number 2    Number of Visits 8    Date for PT Re-Evaluation 06/10/21    Authorization Type [employer] MCR    Progress Note Due on Visit 10    PT Start Time 1701    PT Stop Time 1745    PT Time Calculation (min) 44 min    Activity Tolerance Patient tolerated treatment well    Behavior During Therapy [facility] for tasks assessed/performed             Past Medical History:  Diagnosis Date   Dyslipidemia 08/31/2018   Essential hypertension 08/31/2018   History of stroke 08/31/2018   Hypertension    Inguinal hernia 08/31/2018   Stroke [facility])     History reviewed. No pertinent surgical history.  There were no vitals filed for this visit.   Subjective Assessment - 04/21/21 1705     Subjective Patient reports he is feeling a little bit better. He has an appointment tomorrow to have his nerves checked for neuropathy due to diabetes.    Patient Stated Goals Patient will improve posture, seated tolerance, and exercise ability    Currently in Pain? Yes    Pain Score 1     Pain Location Back    Pain Orientation Lower    Pain Descriptors / Indicators Pressure    Pain Type Chronic pain    Pain Radiating Towards hips    Pain Onset More than a month ago    Pain Frequency Constant                [facility] PT Assessment - 04/21/21 0001       Flexibility   Hamstrings Limited bilaterally, ~40 deg SLR                           [facility] Adult PT Treatment/Exercise - 04/21/21 0001       Exercises   Exercises Neck;Lumbar      Neck Exercises: Theraband   Rows 15 reps;Green   2 sets    Horizontal ABduction 15 reps   yellow, 2 sets     Neck Exercises: Stretches   Other Neck Stretches Seated thoracic extension over back of chair with hands behind head 10 x 5 sec hold      Lumbar Exercises: Stretches   Passive Hamstring Stretch 2 reps;30 seconds    Passive Hamstring Stretch Limitations supine PROM    Single Knee to Chest Stretch 2 reps;30 seconds    Single Knee to Chest Stretch Limitations supine PROM    Lower Trunk Rotation 5 reps;10 seconds    Piriformis Stretch 2 reps;30 seconds    Piriformis Stretch Limitations supine PROM      Lumbar Exercises: Aerobic   Nustep L5 x 5 min with UE/LE while taking subjective      Lumbar Exercises: Seated   Other Seated Lumbar Exercises Pelvic tilts 10 x 5 sec      Lumbar Exercises: Supine   Clam 10 reps;3 seconds   yellow, 2 sets   Bridge 10 reps;3 seconds   2 sets  PT Education - 04/21/21 1708     Education Details HEP, FOTO    Person(s) Educated Patient    Methods Explanation;Demonstration;Verbal cues    Comprehension Verbalized understanding;Returned demonstration;Verbal cues required;Need further instruction              PT Short Term Goals - 04/21/21 1732       PT SHORT TERM GOAL #1   Title Patient will be I with initial HEP to progress with PT    Time 4    Period Weeks    Status New    Target Date 05/13/21      PT SHORT TERM GOAL #2   Title PT will review FOTO with patient by 3rd visit to understand expected outcome    Baseline reviewed 2nd visit    Time 3    Period Weeks    Status Achieved    Target Date 05/06/21      PT SHORT TERM GOAL #3   Title Patient will be able to demonstrate appropriate seated posture without increase in posterior thigh pain/tightness to improve seated tolerance    Time 4    Period Weeks    Status New    Target Date 05/13/21               PT Long Term Goals - 04/15/21 1343       PT LONG TERM GOAL #1   Title Patient will be I with  final HEP to maintain progress from PT    Time 8    Period Weeks    Status New    Target Date 06/10/21      PT LONG TERM GOAL #2   Title Patient will report >/= 47% functional status on FOTO to indicate improved functional ability    Time 8    Period Weeks    Status New    Target Date 06/10/21      PT LONG TERM GOAL #3   Title Patient will be able to tolerate sitting >/= 1 hour in order to improve driving tractor trailors    Time 8    Period Weeks    Status New    Target Date 06/10/21      PT LONG TERM GOAL #4   Title Patient will exhibit >/= 4/5 MMT gluteal and core strength in order to improve ability to resume exercise and walking without limitation or increase in pain    Time 8    Period Weeks    Status New    Target Date 06/10/21                   Plan - 04/21/21 1709     Clinical Impression Statement Patient tolerated therapy well with no adverse effects. Therapy continued to focus on progress lumbar mobility and flexibility, core and postural strengthening. He did report improvement with stretches since last visit so kept exercises consistent and he stated he felt like he was moving better post therapy. He continues to demonstrate stiffness with poor tolerance for proper seated posture. Patient would benefit from continued skilled PT to progress his mobility and strength in order to reduce pain and maximize functional ability.    PT Treatment/Interventions ADLs/Self Care Home Management;Aquatic Therapy;Cryotherapy;Electrical Stimulation;Iontophoresis 4mg /ml Dexamethasone;Moist Heat;Traction;Ultrasound;Neuromuscular re-education;Balance training;Therapeutic exercise;Therapeutic activities;Functional mobility training;Stair training;Gait training;Patient/family education;Manual techniques;Dry needling;Passive range of motion;Taping;Vasopneumatic Device;Spinal Manipulations;Joint Manipulations    PT Next Visit Plan Review HEP and progress PRN, flexibility for hips and  hamstrings, progress core and postural  strength, further assess neck PRN    PT Home Exercise Plan [email or web address]    Consulted and Agree with Plan of Care Patient             Patient will benefit from skilled therapeutic intervention in order to improve the following deficits and impairments:  Decreased range of motion, Decreased activity tolerance, Pain, Postural dysfunction, Impaired flexibility, Decreased strength, Impaired sensation, Decreased balance  Visit Diagnosis: Chronic bilateral low back pain, unspecified whether sciatica present  Cervicalgia  Muscle weakness (generalized)  Abnormal posture     Problem List Patient Active Problem List   Diagnosis Date Noted   Carpal tunnel syndrome, bilateral 10/29/2020   Lumbar radiculopathy 10/29/2020   Lumbar pain 10/29/2020   Premature atrial contractions 05/16/2020   Essential hypertension 08/31/2018   Dyslipidemia 08/31/2018   History of stroke 08/31/2018   Inguinal hernia 08/31/2018    Rosana Hoes, PT, DPT, LAT, ATC 04/21/21  5:51 PM Phone: [phone] Fax: [phone]   [facility] Outpatient Rehabilitation [facility] [address] [place], [place], [address] Phone: [phone]   Fax:  [phone]  Name: Albert [name] MRN: [id number] Date of Birth: [birth year]

## 2021-04-21 NOTE — Patient Instructions (Signed)
Access Code: [email or web address] URL: [email or web address] Date: 04/21/2021 Prepared by: Rosana Hoes  Exercises Supine Piriformis Stretch with Foot on Ground - 1-2 x daily - 7 x weekly - 2 reps - 30 hold Supine Lower Trunk Rotation - 1-2 x daily - 7 x weekly - 5 reps - 10 hold Seated Hamstring Stretch - 1-2 x daily - 7 x weekly - 2 reps - 30 hold Seated Pelvic Tilt - 1-2 x daily - 7 x weekly - 10 reps - 5 hold Seated Thoracic Extension AROM - 1-2 x daily - 7 x weekly - 10 reps - 5 hold Clamshell - 1 x daily - 7 x weekly - 2 sets - 15 reps Bridge - 1 x daily - 7 x weekly - 2 sets - 10 reps - 3 hold Banded Row - 1 x daily - 7 x weekly - 3 sets - 10 reps

## 2021-04-27 ENCOUNTER — Ambulatory Visit: Payer: Medicare (Managed Care) | Admitting: Physical Therapy

## 2021-04-27 ENCOUNTER — Other Ambulatory Visit: Payer: Self-pay

## 2021-04-27 ENCOUNTER — Encounter: Payer: Self-pay | Admitting: Physical Therapy

## 2021-04-27 DIAGNOSIS — M545 Low back pain, unspecified: Secondary | ICD-10-CM | POA: Diagnosis not present

## 2021-04-27 DIAGNOSIS — M542 Cervicalgia: Secondary | ICD-10-CM

## 2021-04-27 DIAGNOSIS — G8929 Other chronic pain: Secondary | ICD-10-CM

## 2021-04-27 DIAGNOSIS — M6281 Muscle weakness (generalized): Secondary | ICD-10-CM

## 2021-04-27 DIAGNOSIS — R293 Abnormal posture: Secondary | ICD-10-CM

## 2021-04-27 NOTE — Patient Instructions (Signed)
Access Code: [email or web address] URL: [email or web address] Date: 04/27/2021 Prepared by: Rosana Hoes  Exercises Supine Piriformis Stretch with Foot on Ground - 1-2 x daily - 7 x weekly - 2 reps - 30 hold Supine Lower Trunk Rotation - 1-2 x daily - 7 x weekly - 5 reps - 10 hold Cat Cow - 1-2 x daily - 7 x weekly - 10 reps Seated Hamstring Stretch - 1-2 x daily - 7 x weekly - 2 reps - 30 hold Seated Pelvic Tilt - 1-2 x daily - 7 x weekly - 10 reps - 5 hold Seated Thoracic Extension AROM - 1-2 x daily - 7 x weekly - 10 reps - 5 hold Bridge - 1 x daily - 7 x weekly - 2 sets - 10 reps - 3 hold Clamshell with Resistance - 1 x daily - 7 x weekly - 2 sets - 15 reps Banded Row - 1 x daily - 7 x weekly - 3 sets - 10 reps

## 2021-04-27 NOTE — Therapy (Signed)
[facility] Outpatient Rehabilitation [facility] [address] [place], [place], [address] Phone: [phone]   Fax:  [phone]  Physical Therapy Treatment  Patient Details  Name: Albert [name] MRN: [id number] Date of Birth: 01/16/1946 Referring Provider (PT): Sheral Apley, MD   Encounter Date: 04/27/2021   PT End of Session - 04/27/21 1639     Visit Number 3    Number of Visits 8    Date for PT Re-Evaluation 06/10/21    Authorization Type [employer] MCR    Progress Note Due on Visit 10    PT Start Time 1615    PT Stop Time 1700    PT Time Calculation (min) 45 min    Activity Tolerance Patient tolerated treatment well    Behavior During Therapy [facility] for tasks assessed/performed             Past Medical History:  Diagnosis Date   Dyslipidemia 08/31/2018   Essential hypertension 08/31/2018   History of stroke 08/31/2018   Hypertension    Inguinal hernia 08/31/2018   Stroke [facility])     History reviewed. No pertinent surgical history.  There were no vitals filed for this visit.   Subjective Assessment - 04/27/21 1624     Subjective Patient reports overall he has been feeling better with both good and bad days.    Patient Stated Goals Patient will improve posture, seated tolerance, and exercise ability    Currently in Pain? Yes    Pain Score 3     Pain Location Back    Pain Orientation Lower;Left    Pain Descriptors / Indicators Pressure    Pain Type Chronic pain    Pain Onset More than a month ago    Pain Frequency Constant    Aggravating Factors  Sitting or walking extended periods, lifting                [facility] PT Assessment - 04/27/21 0001       Posture/Postural Control   Posture Comments Patient continues to exhibit difficulty maintaining seated posture without posterior pelvic tilt                           [facility] Adult PT Treatment/Exercise - 04/27/21 0001       Exercises   Exercises Neck;Lumbar      Neck  Exercises: Stretches   Other Neck Stretches Seated thoracic extension over back of chair with hands behind head 10 x 5 sec hold      Lumbar Exercises: Stretches   Passive Hamstring Stretch 2 reps;30 seconds    Passive Hamstring Stretch Limitations supine PROM    Lower Trunk Rotation 5 reps;10 seconds    Hip Flexor Stretch 2 reps;30 seconds    Hip Flexor Stretch Limitations modified thomas PROM    Piriformis Stretch 2 reps;30 seconds    Piriformis Stretch Limitations supine PROM      Lumbar Exercises: Aerobic   Nustep L5 x 5 min with UE/LE while taking subjective      Lumbar Exercises: Seated   Other Seated Lumbar Exercises Pelvic tilts x 10      Lumbar Exercises: Supine   Bridge 10 reps;3 seconds      Lumbar Exercises: Sidelying   Clam 15 reps   2 sets   Clam Limitations yellow      Lumbar Exercises: Quadruped   Madcat/Old Horse 10 reps  PT Education - 04/27/21 1639     Education Details HEP update    Person(s) Educated Patient    Methods Explanation;Demonstration;Verbal cues;Handout    Comprehension Verbalized understanding;Returned demonstration;Verbal cues required;Need further instruction              PT Short Term Goals - 04/27/21 1643       PT SHORT TERM GOAL #1   Title Patient will be I with initial HEP to progress with PT    Baseline progressing with HEP    Time 4    Period Weeks    Status On-going    Target Date 05/13/21      PT SHORT TERM GOAL #2   Title PT will review FOTO with patient by 3rd visit to understand expected outcome    Baseline reviewed 2nd visit    Time 3    Period Weeks    Status Achieved    Target Date 05/06/21      PT SHORT TERM GOAL #3   Title Patient will be able to demonstrate appropriate seated posture without increase in posterior thigh pain/tightness to improve seated tolerance    Baseline patient continues to exhibit difficulty maintaining seated posture without posterior pelvic tilt     Time 4    Period Weeks    Status On-going    Target Date 05/13/21               PT Long Term Goals - 04/15/21 1343       PT LONG TERM GOAL #1   Title Patient will be I with final HEP to maintain progress from PT    Time 8    Period Weeks    Status New    Target Date 06/10/21      PT LONG TERM GOAL #2   Title Patient will report >/= 47% functional status on FOTO to indicate improved functional ability    Time 8    Period Weeks    Status New    Target Date 06/10/21      PT LONG TERM GOAL #3   Title Patient will be able to tolerate sitting >/= 1 hour in order to improve driving tractor trailors    Time 8    Period Weeks    Status New    Target Date 06/10/21      PT LONG TERM GOAL #4   Title Patient will exhibit >/= 4/5 MMT gluteal and core strength in order to improve ability to resume exercise and walking without limitation or increase in pain    Time 8    Period Weeks    Status New    Target Date 06/10/21                   Plan - 04/27/21 1640     Clinical Impression Statement Patient tolerated therapy well with no adverse effects. He continues to demonstrate L > R hamstring tightness and was reporting greater buttock and posterior thigh discomfort with his seated pelvic tilts this visit. He continues to demonstrate difficulty maintaining proper seated posture and will revert back to kyphotic posture when not cued. Exercises progressed this visit for strengthening and core control. Patient would benefit from continued skilled PT to progress his mobility and strength in order to reduce pain and maximize functional ability.    PT Treatment/Interventions ADLs/Self Care Home Management;Aquatic Therapy;Cryotherapy;Electrical Stimulation;Iontophoresis 4mg /ml Dexamethasone;Moist Heat;Traction;Ultrasound;Neuromuscular re-education;Balance training;Therapeutic exercise;Therapeutic activities;Functional mobility training;Stair training;Gait training;Patient/family  education;Manual techniques;Dry needling;Passive range of  motion;Taping;Vasopneumatic Device;Spinal Manipulations;Joint Manipulations    PT Next Visit Plan Review HEP and progress PRN, flexibility for hips and hamstrings, progress core and postural strength, further assess neck PRN    PT Home Exercise Plan [email or web address]    Consulted and Agree with Plan of Care Patient             Patient will benefit from skilled therapeutic intervention in order to improve the following deficits and impairments:  Decreased range of motion, Decreased activity tolerance, Pain, Postural dysfunction, Impaired flexibility, Decreased strength, Impaired sensation, Decreased balance  Visit Diagnosis: Chronic bilateral low back pain, unspecified whether sciatica present  Cervicalgia  Muscle weakness (generalized)  Abnormal posture     Problem List Patient Active Problem List   Diagnosis Date Noted   Carpal tunnel syndrome, bilateral 10/29/2020   Lumbar radiculopathy 10/29/2020   Lumbar pain 10/29/2020   Premature atrial contractions 05/16/2020   Essential hypertension 08/31/2018   Dyslipidemia 08/31/2018   History of stroke 08/31/2018   Inguinal hernia 08/31/2018    Rosana Hoes, PT, DPT, LAT, ATC 04/27/21  5:12 PM Phone: [phone] Fax: [phone]   [facility] [address] [place], [place], [address] Phone: [phone]   Fax:  [phone]  Name: Faisal Stradling MRN: [id number] Date of Birth: 1945/12/13

## 2021-05-04 ENCOUNTER — Encounter: Payer: Medicare (Managed Care) | Admitting: Physical Therapy

## 2021-05-05 ENCOUNTER — Other Ambulatory Visit: Payer: Self-pay

## 2021-05-05 ENCOUNTER — Ambulatory Visit: Payer: Medicare (Managed Care) | Admitting: Physical Therapy

## 2021-05-05 ENCOUNTER — Encounter: Payer: Self-pay | Admitting: Physical Therapy

## 2021-05-05 DIAGNOSIS — R293 Abnormal posture: Secondary | ICD-10-CM

## 2021-05-05 DIAGNOSIS — M6281 Muscle weakness (generalized): Secondary | ICD-10-CM

## 2021-05-05 DIAGNOSIS — G8929 Other chronic pain: Secondary | ICD-10-CM

## 2021-05-05 DIAGNOSIS — M542 Cervicalgia: Secondary | ICD-10-CM

## 2021-05-05 DIAGNOSIS — M545 Low back pain, unspecified: Secondary | ICD-10-CM

## 2021-05-05 NOTE — Therapy (Signed)
[facility] Outpatient Rehabilitation [facility] [address] [place], [place], [address] Phone: [phone]   Fax:  [phone]  Physical Therapy Treatment  Patient Details  Name: Albert [name] MRN: [id number] Date of Birth: 10-30-1945 Referring Provider (PT): Sheral Apley, MD   Encounter Date: 05/05/2021   PT End of Session - 05/05/21 1106     Visit Number 4    Number of Visits 8    Date for PT Re-Evaluation 06/10/21    Authorization Type [employer] MCR    Progress Note Due on Visit 10    PT Start Time 1058   patient arrived late   PT Stop Time 1130    PT Time Calculation (min) 32 min    Activity Tolerance Patient tolerated treatment well    Behavior During Therapy [facility] for tasks assessed/performed             Past Medical History:  Diagnosis Date   Dyslipidemia 08/31/2018   Essential hypertension 08/31/2018   History of stroke 08/31/2018   Hypertension    Inguinal hernia 08/31/2018   Stroke [facility])     History reviewed. No pertinent surgical history.  There were no vitals filed for this visit.   Subjective Assessment - 05/05/21 1058     Subjective Patient reports he is doing ok.    Patient Stated Goals Patient will improve posture, seated tolerance, and exercise ability    Currently in Pain? Yes    Pain Score 3     Pain Location Back    Pain Orientation Lower    Pain Descriptors / Indicators Pressure    Pain Type Chronic pain    Pain Onset More than a month ago    Pain Frequency Constant    Aggravating Factors  Sitting extended periods                [facility] PT Assessment - 05/05/21 0001       Flexibility   Hamstrings Limited bilaterally, ~45 deg SLR                           [facility] Adult PT Treatment/Exercise - 05/05/21 0001       Neck Exercises: Stretches   Other Neck Stretches Seated thoracic extension over back of chair with hands behind head 10 x 5 sec hold      Lumbar Exercises: Stretches   Passive  Hamstring Stretch 3 reps;20 seconds    Passive Hamstring Stretch Limitations supine PROM and seated edge of table    Single Knee to Chest Stretch 2 reps;20 seconds    Single Knee to Chest Stretch Limitations supine PROM    Lower Trunk Rotation 5 reps;10 seconds    Hip Flexor Stretch 2 reps;20 seconds    Hip Flexor Stretch Limitations modified thomas PROM    Piriformis Stretch 2 reps;20 seconds    Piriformis Stretch Limitations supine PROM    Other Lumbar Stretch Exercise Sidelying thoracic rotation 10 x 5 sec each      Lumbar Exercises: Seated   Other Seated Lumbar Exercises Pelvic tilts on physioball x 10      Lumbar Exercises: Quadruped   Madcat/Old Horse 10 reps                    PT Education - 05/05/21 1105     Education Details HEP    Person(s) Educated Patient    Methods Explanation;Demonstration;Verbal cues    Comprehension Verbalized understanding;Returned  demonstration;Verbal cues required;Need further instruction              PT Short Term Goals - 04/27/21 1643       PT SHORT TERM GOAL #1   Title Patient will be I with initial HEP to progress with PT    Baseline progressing with HEP    Time 4    Period Weeks    Status On-going    Target Date 05/13/21      PT SHORT TERM GOAL #2   Title PT will review FOTO with patient by 3rd visit to understand expected outcome    Baseline reviewed 2nd visit    Time 3    Period Weeks    Status Achieved    Target Date 05/06/21      PT SHORT TERM GOAL #3   Title Patient will be able to demonstrate appropriate seated posture without increase in posterior thigh pain/tightness to improve seated tolerance    Baseline patient continues to exhibit difficulty maintaining seated posture without posterior pelvic tilt    Time 4    Period Weeks    Status On-going    Target Date 05/13/21               PT Long Term Goals - 04/15/21 1343       PT LONG TERM GOAL #1   Title Patient will be I with final HEP to  maintain progress from PT    Time 8    Period Weeks    Status New    Target Date 06/10/21      PT LONG TERM GOAL #2   Title Patient will report >/= 47% functional status on FOTO to indicate improved functional ability    Time 8    Period Weeks    Status New    Target Date 06/10/21      PT LONG TERM GOAL #3   Title Patient will be able to tolerate sitting >/= 1 hour in order to improve driving tractor trailors    Time 8    Period Weeks    Status New    Target Date 06/10/21      PT LONG TERM GOAL #4   Title Patient will exhibit >/= 4/5 MMT gluteal and core strength in order to improve ability to resume exercise and walking without limitation or increase in pain    Time 8    Period Weeks    Status New    Target Date 06/10/21                   Plan - 05/05/21 1106     Clinical Impression Statement Patient tolerated therapy well with no adverse effects. Therapy limited due to patient arriving late. Therapy focused on progressing flexibility and spinal mobility. He continues to demonstrate gross flexibility deficit. Able to progress to pelvic tilts on physioball. Added sidelying rotation to HEP. Patient would benefit from continued skilled PT to progress his mobility and strength in order to reduce pain and maximize functional ability.    PT Treatment/Interventions ADLs/Self Care Home Management;Aquatic Therapy;Cryotherapy;Electrical Stimulation;Iontophoresis 4mg /ml Dexamethasone;Moist Heat;Traction;Ultrasound;Neuromuscular re-education;Balance training;Therapeutic exercise;Therapeutic activities;Functional mobility training;Stair training;Gait training;Patient/family education;Manual techniques;Dry needling;Passive range of motion;Taping;Vasopneumatic Device;Spinal Manipulations;Joint Manipulations    PT Next Visit Plan Review HEP and progress PRN, flexibility for hips and hamstrings, progress core and postural strength, further assess neck PRN    PT Home Exercise Plan [email or web address]     Consulted and Agree with Plan of Care Patient  Patient will benefit from skilled therapeutic intervention in order to improve the following deficits and impairments:  Decreased range of motion, Decreased activity tolerance, Pain, Postural dysfunction, Impaired flexibility, Decreased strength, Impaired sensation, Decreased balance  Visit Diagnosis: Chronic bilateral low back pain, unspecified whether sciatica present  Cervicalgia  Muscle weakness (generalized)  Abnormal posture     Problem List Patient Active Problem List   Diagnosis Date Noted   Carpal tunnel syndrome, bilateral 10/29/2020   Lumbar radiculopathy 10/29/2020   Lumbar pain 10/29/2020   Premature atrial contractions 05/16/2020   Essential hypertension 08/31/2018   Dyslipidemia 08/31/2018   History of stroke 08/31/2018   Inguinal hernia 08/31/2018    Rosana Hoes, PT, DPT, LAT, ATC 05/05/21  11:32 AM Phone: [phone] Fax: [phone]   [facility] Outpatient Rehabilitation [facility] [address] [place], [place], [address] Phone: [phone]   Fax:  [phone]  Name: Numa Schroeter MRN: [id number] Date of Birth: March 21, 1946

## 2021-05-07 ENCOUNTER — Other Ambulatory Visit: Payer: Self-pay | Admitting: Family Medicine

## 2021-05-21 ENCOUNTER — Encounter: Payer: Self-pay | Admitting: Physical Therapy

## 2021-05-21 ENCOUNTER — Ambulatory Visit: Payer: Medicare (Managed Care) | Attending: Orthopedic Surgery | Admitting: Physical Therapy

## 2021-05-21 ENCOUNTER — Other Ambulatory Visit: Payer: Self-pay

## 2021-05-21 DIAGNOSIS — M6281 Muscle weakness (generalized): Secondary | ICD-10-CM | POA: Diagnosis present

## 2021-05-21 DIAGNOSIS — R293 Abnormal posture: Secondary | ICD-10-CM | POA: Insufficient documentation

## 2021-05-21 DIAGNOSIS — M545 Low back pain, unspecified: Secondary | ICD-10-CM | POA: Insufficient documentation

## 2021-05-21 DIAGNOSIS — G8929 Other chronic pain: Secondary | ICD-10-CM | POA: Diagnosis present

## 2021-05-21 DIAGNOSIS — M542 Cervicalgia: Secondary | ICD-10-CM | POA: Insufficient documentation

## 2021-05-21 NOTE — Therapy (Addendum)
[place], [place], [address] Phone: [phone]   Fax:  [phone]  Physical Therapy Treatment / Discharge  Patient Details  Name: Nghia Mcentee MRN: [id number] Date of Birth: [birth year] Referring Provider (PT): Renette Butters, MD   Encounter Date: 05/21/2021   PT End of Session - 05/21/21 1558     Visit Number 5    Number of Visits 8    Date for PT Re-Evaluation 06/10/21    Authorization Type [employer] MCR    Progress Note Due on Visit 10    PT Start Time 1600    PT Stop Time 1640    PT Time Calculation (min) 40 min    Activity Tolerance Patient tolerated treatment well    Behavior During Therapy [facility] for tasks assessed/performed             Past Medical History:  Diagnosis Date   Dyslipidemia 08/31/2018   Essential hypertension 08/31/2018   History of stroke 08/31/2018   Hypertension    Inguinal hernia 08/31/2018   Stroke [facility])     History reviewed. No pertinent surgical history.  There were no vitals filed for this visit.   Subjective Assessment - 05/21/21 1621     Subjective Patient reports his back is getting better, the exercises are going well with no new issues. He does report constant numbness in the tips of his fingers.    Patient Stated Goals Patient will improve posture, seated tolerance, and exercise ability    Currently in Pain? Yes    Pain Score 0-No pain                [facility] PT Assessment - 05/21/21 0001       Assessment   Medical Diagnosis Cervicalgia with bilater radiculopathy and lumbago with bilateral sciatica    Referring Provider (PT) Renette Butters, MD      Posture/Postural Control   Posture Comments Rounded shoulder and forward head posture      AROM   Lumbar Flexion WFL - hamstring tightness noted    Lumbar Extension 50%    Lumbar - Right Side Bend [facility]    Lumbar - Left Side Bend [facility]    Lumbar - Right Rotation 75%    Lumbar - Left  Rotation 75%                           [facility] Adult PT Treatment/Exercise - 05/21/21 0001       Exercises   Exercises Neck;Lumbar      Neck Exercises: Theraband   Rows 15 reps;Green   2 sets     Neck Exercises: Seated   Neck Retraction 10 reps;5 secs    Neck Retraction Limitations with self manual overpressure at chin      Neck Exercises: Supine   Neck Retraction 10 reps;5 secs   2 sets     Neck Exercises: Stretches   Other Neck Stretches Seated thoracic extension over back of chair with hands behind head 10 x 5 sec hold    Other Neck Stretches Sidelying thoracic rotation 10 x 5 sec      Lumbar Exercises: Stretches   Passive Hamstring Stretch 5 reps;10 seconds    Passive Hamstring Stretch Limitations contract relax PNF stretching    Lower Trunk Rotation 3 reps;10 seconds    Hip Flexor Stretch 3 reps;20 seconds    Hip Flexor Stretch Limitations modified thomas PROM  Piriformis Stretch 3 reps;20 seconds    Piriformis Stretch Limitations supine      Lumbar Exercises: Aerobic   Nustep L5 x 5 min with UE/LE while taking subjective      Manual Therapy   Manual Therapy Myofascial release    Myofascial Release Suboccipital release with gentle manual cervical traction                     PT Education - 05/21/21 1554     Education Details HEP update    Person(s) Educated Patient    Methods Explanation;Demonstration;Verbal cues;Handout    Comprehension Verbalized understanding;Returned demonstration;Verbal cues required;Need further instruction              PT Short Term Goals - 05/21/21 1601       PT SHORT TERM GOAL #1   Title Patient will be I with initial HEP to progress with PT    Baseline progressing toward final HEP    Time 4    Period Weeks    Status Achieved    Target Date 05/13/21      PT SHORT TERM GOAL #2   Title PT will review FOTO with patient by 3rd visit to understand expected outcome    Baseline reviewed 2nd visit     Time 3    Period Weeks    Status Achieved    Target Date 05/06/21      PT SHORT TERM GOAL #3   Title Patient will be able to demonstrate appropriate seated posture without increase in posterior thigh pain/tightness to improve seated tolerance    Baseline patient continues to exhibit difficulty maintaining seated posture without posterior pelvic tilt    Time 4    Period Weeks    Status On-going    Target Date 05/13/21               PT Long Term Goals - 04/15/21 1343       PT LONG TERM GOAL #1   Title Patient will be I with final HEP to maintain progress from PT    Time 8    Period Weeks    Status New    Target Date 06/10/21      PT LONG TERM GOAL #2   Title Patient will report >/= 47% functional status on FOTO to indicate improved functional ability    Time 8    Period Weeks    Status New    Target Date 06/10/21      PT LONG TERM GOAL #3   Title Patient will be 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 Therapy continued to focus on improving postural control and incorporated supine and seated chin tucks. Patient did require consistent cueing and self tactile cueing for  proper technique. 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 He continues to have constant numbness in fingers with unclear etiology as no provoked with radicular testing. Updated HEP to include seated chin tucks to improve posture while driving.  Patient would benefit from continued skilled PT to progress his mobility and strength in order to reduce pain and maximize functional ability.    PT Treatment/Interventions ADLs/Self Care Home Management;Aquatic Therapy;Cryotherapy;Electrical Stimulation;Iontophoresis 35m/ml Dexamethasone;Moist Heat;Traction;Ultrasound;Neuromuscular re-education;Balance training;Therapeutic exercise;Therapeutic activities;Functional mobility training;Stair training;Gait training;Patient/family education;Manual techniques;Dry needling;Passive range of motion;Taping;Vasopneumatic Device;Spinal Manipulations;Joint Manipulations    PT Next Visit Plan Review HEP and progress PRN, flexibility for hips and hamstrings, progress core and postural strength, further assess neck PRN    PT Home Exercise Plan [email or web address]    Consulted and Agree with Plan of Care Patient             Patient will benefit from skilled therapeutic intervention in order to improve the following deficits and impairments:  Decreased range of motion, Decreased activity tolerance, Pain, Postural dysfunction, Impaired flexibility, Decreased strength, Impaired sensation, Decreased balance  Visit Diagnosis: Chronic bilateral low back pain, unspecified whether sciatica present  Cervicalgia  Muscle weakness (generalized)  Abnormal posture     Problem List Patient Active Problem List   Diagnosis Date Noted   Carpal tunnel syndrome, bilateral 10/29/2020   Lumbar radiculopathy 10/29/2020   Lumbar pain 10/29/2020   Premature atrial contractions 05/16/2020   Essential hypertension 08/31/2018   Dyslipidemia 08/31/2018   History of stroke 08/31/2018   Inguinal hernia 08/31/2018    CHilda Blades PT, DPT, LAT, ATC 05/21/21  5:20 PM Phone: [phone]: [address] [address]Phone: [phone]  Fax:  [phone] Name: HLinus WeckerlyMRN:  [id number] of Birth: [birth year]  PHYSICAL THERAPY DISCHARGE SUMMARY  Visits from Start of Care: 5  Current functional level related to goals / functional outcomes: See above   Remaining deficits: See above   Education / Equipment: HEP   Patient agrees to discharge. Patient goals were partially met. Patient is being discharged due to not returning since the last visit.  CHilda Blades PT, DPT, LAT, ATC 06/17/21  8:56 AM Phone: [phone]: [phone]

## 2021-05-21 NOTE — Patient Instructions (Signed)
Access Code: [email or web address] URL: [email or web address] Date: 05/21/2021 Prepared by: Rosana Hoes  Exercises Supine Piriformis Stretch with Foot on Ground - 1-2 x daily - 7 x weekly - 2 reps - 30 hold Supine Lower Trunk Rotation - 1-2 x daily - 7 x weekly - 5 reps - 10 hold Sidelying Thoracic Lumbar Rotation - 1-2 x daily - 7 x weekly - 10 reps - 5 hold Cat Cow - 1-2 x daily - 7 x weekly - 10 reps Seated Hamstring Stretch - 1-2 x daily - 7 x weekly - 2 reps - 30 hold Seated Pelvic Tilt - 1-2 x daily - 7 x weekly - 10 reps - 5 hold Seated Thoracic Extension AROM - 1-2 x daily - 7 x weekly - 10 reps - 5 hold Bridge - 1 x daily - 7 x weekly - 2 sets - 10 reps - 3 hold Clamshell with Resistance - 1 x daily - 7 x weekly - 2 sets - 15 reps Banded Row - 1 x daily - 7 x weekly - 3 sets - 10 reps Seated Passive Cervical Retraction - 1-2 x daily - 7 x weekly - 2 sets - 10 reps - 5 hold

## 2021-07-02 ENCOUNTER — Other Ambulatory Visit: Payer: Self-pay | Admitting: *Deleted

## 2021-07-02 DIAGNOSIS — D649 Anemia, unspecified: Secondary | ICD-10-CM

## 2021-07-02 NOTE — Progress Notes (Signed)
Patient scheduled for New Pt appt on 11/30 at 11:15 with Lonna Cobb NP, lab orders entered

## 2021-07-21 ENCOUNTER — Inpatient Hospital Stay: Payer: Medicare (Managed Care) | Attending: Oncology

## 2021-07-21 ENCOUNTER — Other Ambulatory Visit: Payer: Self-pay

## 2021-07-21 DIAGNOSIS — D649 Anemia, unspecified: Secondary | ICD-10-CM | POA: Diagnosis present

## 2021-07-21 LAB — CBC WITH DIFFERENTIAL (CANCER CENTER ONLY)
Abs Immature Granulocytes: 0.01 10*3/uL (ref 0.00–0.07)
Basophils Absolute: 0 10*3/uL (ref 0.0–0.1)
Basophils Relative: 0 %
Eosinophils Absolute: 0.3 10*3/uL (ref 0.0–0.5)
Eosinophils Relative: 7 %
HCT: 32.3 % — ABNORMAL LOW (ref 39.0–52.0)
Hemoglobin: 11.6 g/dL — ABNORMAL LOW (ref 13.0–17.0)
Immature Granulocytes: 0 %
Lymphocytes Relative: 28 %
Lymphs Abs: 1.4 10*3/uL (ref 0.7–4.0)
MCH: 31.8 pg (ref 26.0–34.0)
MCHC: 35.9 g/dL (ref 30.0–36.0)
MCV: 88.5 fL (ref 80.0–100.0)
Monocytes Absolute: 0.4 10*3/uL (ref 0.1–1.0)
Monocytes Relative: 9 %
Neutro Abs: 2.7 10*3/uL (ref 1.7–7.7)
Neutrophils Relative %: 56 %
Platelet Count: 192 10*3/uL (ref 150–400)
RBC: 3.65 MIL/uL — ABNORMAL LOW (ref 4.22–5.81)
RDW: 12.1 % (ref 11.5–15.5)
WBC Count: 4.8 10*3/uL (ref 4.0–10.5)
nRBC: 0 % (ref 0.0–0.2)

## 2021-07-21 LAB — SAVE SMEAR(SSMR), FOR PROVIDER SLIDE REVIEW

## 2021-08-03 ENCOUNTER — Inpatient Hospital Stay: Payer: Medicare (Managed Care)

## 2021-08-05 ENCOUNTER — Inpatient Hospital Stay: Payer: Medicare (Managed Care) | Admitting: Nurse Practitioner

## 2021-08-11 ENCOUNTER — Inpatient Hospital Stay: Payer: Medicare (Managed Care) | Admitting: Nurse Practitioner

## 2021-08-11 ENCOUNTER — Other Ambulatory Visit: Payer: Self-pay

## 2021-08-11 NOTE — Progress Notes (Deleted)
New Hematology/Oncology Consult   Requesting MD: Mack Guise, [place]  [phone]      Reason for Consult: Anemia  HPI: Albert [name] is a 75 year old man referred for evaluation of anemia.  He was seen for follow-up of stable chronic medical conditions by Mack Guise, PA on 06/18/2021.  CBC at that time returned with a hemoglobin of 11.8, MCV 90, total white count 4.8, platelet count 192,000; chemistry panel with creatinine 1.1, total bilirubin 0.8, ALT and AST mildly elevated at 41 and 36 respectively.  CBC from 11/07/2020 shows hemoglobin 12.4, MCV 92, white count 4.3, platelet count 163,000.  CBC from 06/30/2020 shows hemoglobin 12.5.  Labs done in our office 07/21/2021 returned with hemoglobin 11.6, MCV 88, white count 4.8 and platelet count 192,000.   Past Medical History:  Diagnosis Date   Dyslipidemia 08/31/2018   Essential hypertension 08/31/2018   History of stroke 08/31/2018   Hypertension    Inguinal hernia 08/31/2018   Stroke (HCC)   :  No past surgical history on file.:   Current Outpatient Medications:    amLODipine (NORVASC) 10 MG tablet, Take by mouth daily., Disp: , Rfl:    atorvastatin (LIPITOR) 20 MG tablet, Take 20 mg by mouth daily., Disp: , Rfl:    diclofenac (VOLTAREN) 75 MG EC tablet, Take 1 tablet (75 mg total) by mouth 2 (two) times daily. (Patient taking differently: Take 75 mg by mouth as needed.), Disp: 60 tablet, Rfl: 5   hydrochlorothiazide (HYDRODIURIL) 25 MG tablet, Take 25 mg by mouth daily., Disp: , Rfl:    losartan (COZAAR) 50 MG tablet, Take 100 mg by mouth daily., Disp: , Rfl: :    Allergies  Allergen Reactions   Accupril [Quinapril Hcl] Swelling   Shellfish Allergy    Aspirin Nausea Only  :  FH:  SOCIAL HISTORY:  Review of Systems:    Physical Exam:  There were no vitals taken for this visit.  HEENT: *** Lungs: *** Cardiac: *** Abdomen: *** GU: ***  Vascular: *** Lymph nodes: *** Neurologic: *** Skin:  *** Musculoskeletal: ***  LABS:  No results for input(s): WBC, HGB, HCT, PLT in the last 72 hours.  No results for input(s): NA, K, CL, CO2, GLUCOSE, BUN, CREATININE, CALCIUM in the last 72 hours.    RADIOLOGY:  No results found.  Assessment and Plan:   ***    Albert Cobb, NP 08/11/2021, 8:11 AM

## 2021-08-12 ENCOUNTER — Telehealth: Payer: Self-pay | Admitting: Nurse Practitioner

## 2021-08-12 NOTE — Telephone Encounter (Signed)
Scheduled appointment per 12/06 inbasket. Patient aware.

## 2021-08-13 ENCOUNTER — Telehealth: Payer: Self-pay

## 2021-08-13 NOTE — Telephone Encounter (Signed)
Pt was returning a call. Please Advise.  

## 2021-08-13 NOTE — Telephone Encounter (Signed)
  Encourage patient to contact the pharmacy for refills or they can request refills through [facility]  LAST APPOINTMENT DATE:  10/28/20  NEXT APPOINTMENT DATE: 08/18/2021  MEDICATION: Sodium   Is the patient out of medication? YES  PHARMACY: [id number] - [place], [place] - [address]  Let patient know to contact pharmacy at the end of the day to make sure medication is ready.  Please notify patient to allow 48-72 hours to process

## 2021-08-13 NOTE — Telephone Encounter (Signed)
Left message to return call to our office at their convenience.   Verification  medication refills

## 2021-08-15 IMAGING — DX DG LUMBAR SPINE COMPLETE 4+V
5 series · 5 of 5 positions shown · non-contrast
Comparison: None.

CLINICAL DATA: Chronic low back pain

EXAM:
LUMBAR SPINE - COMPLETE 4+ VIEW

[l-spine ap]
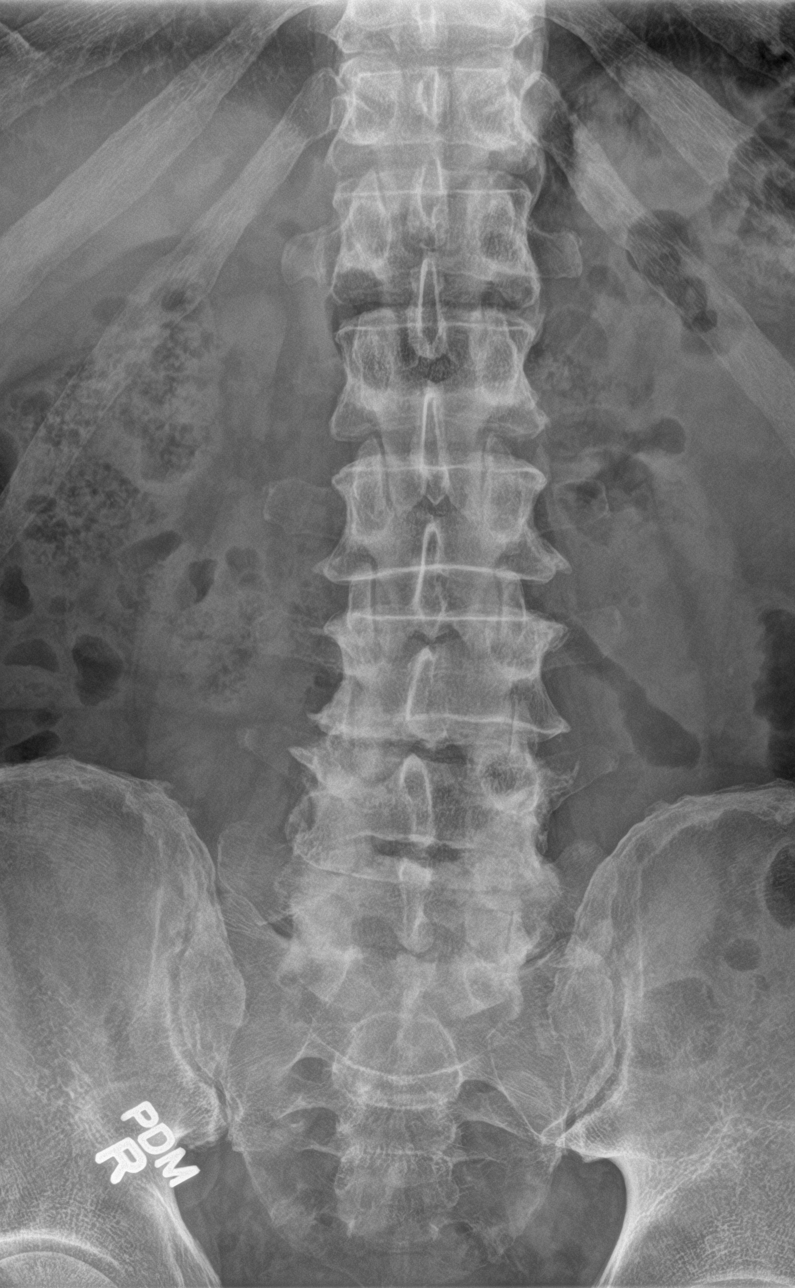

[l-spine obl (1 of 2)]
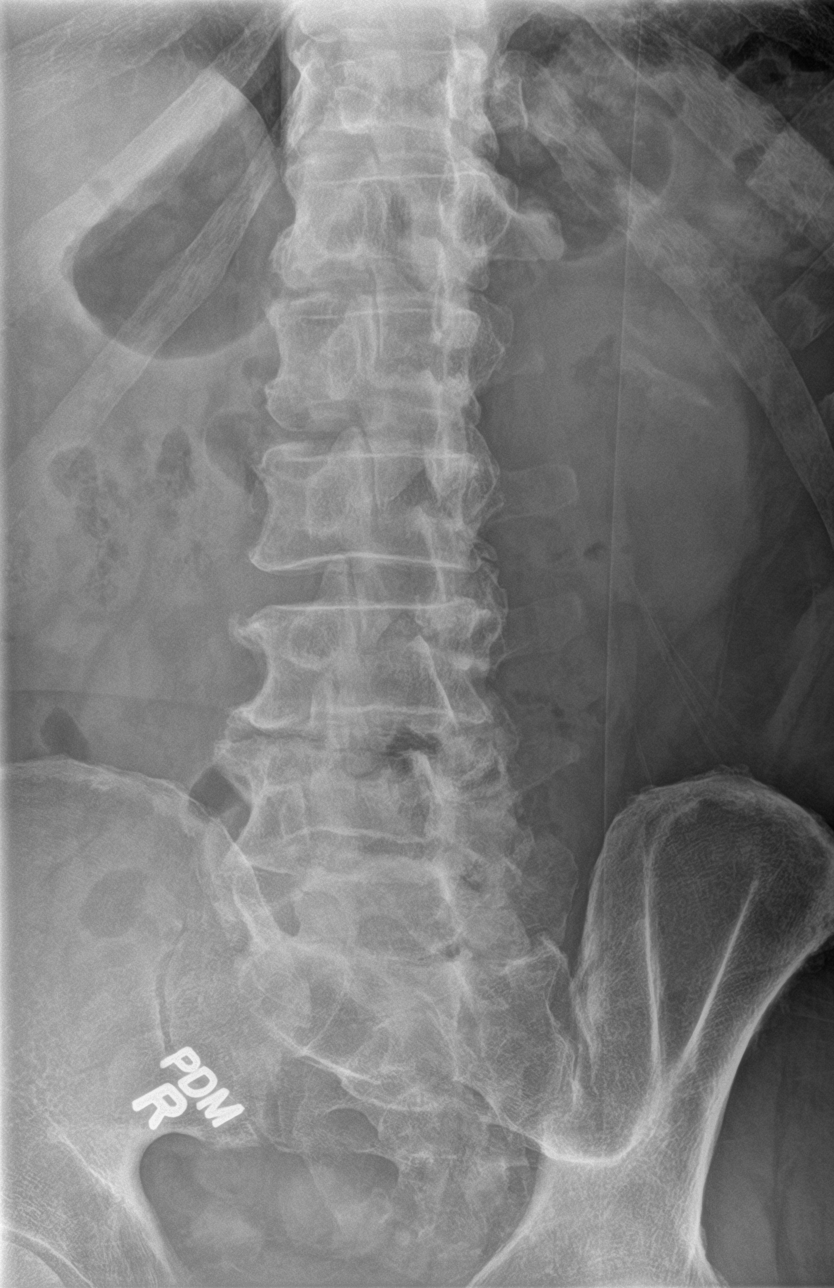

[l-spine obl (2 of 2)]
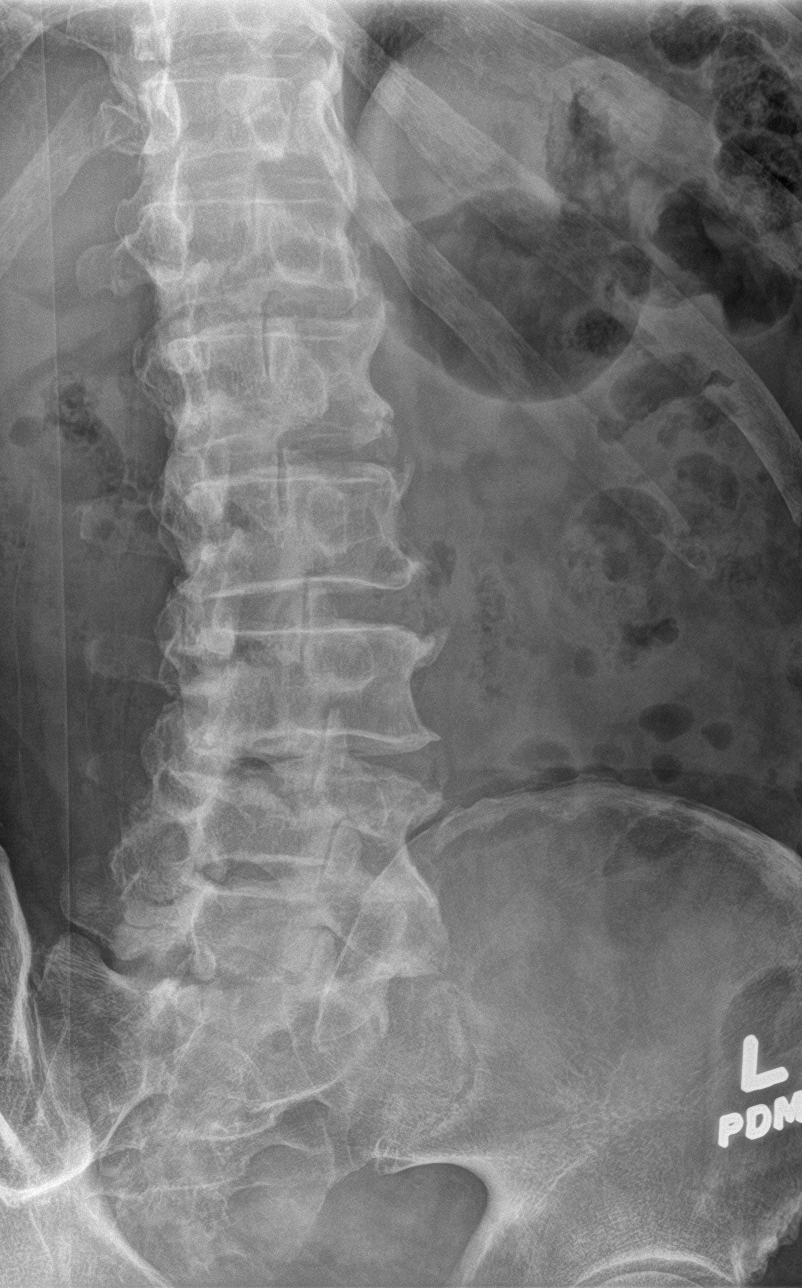

[l-spine lat]
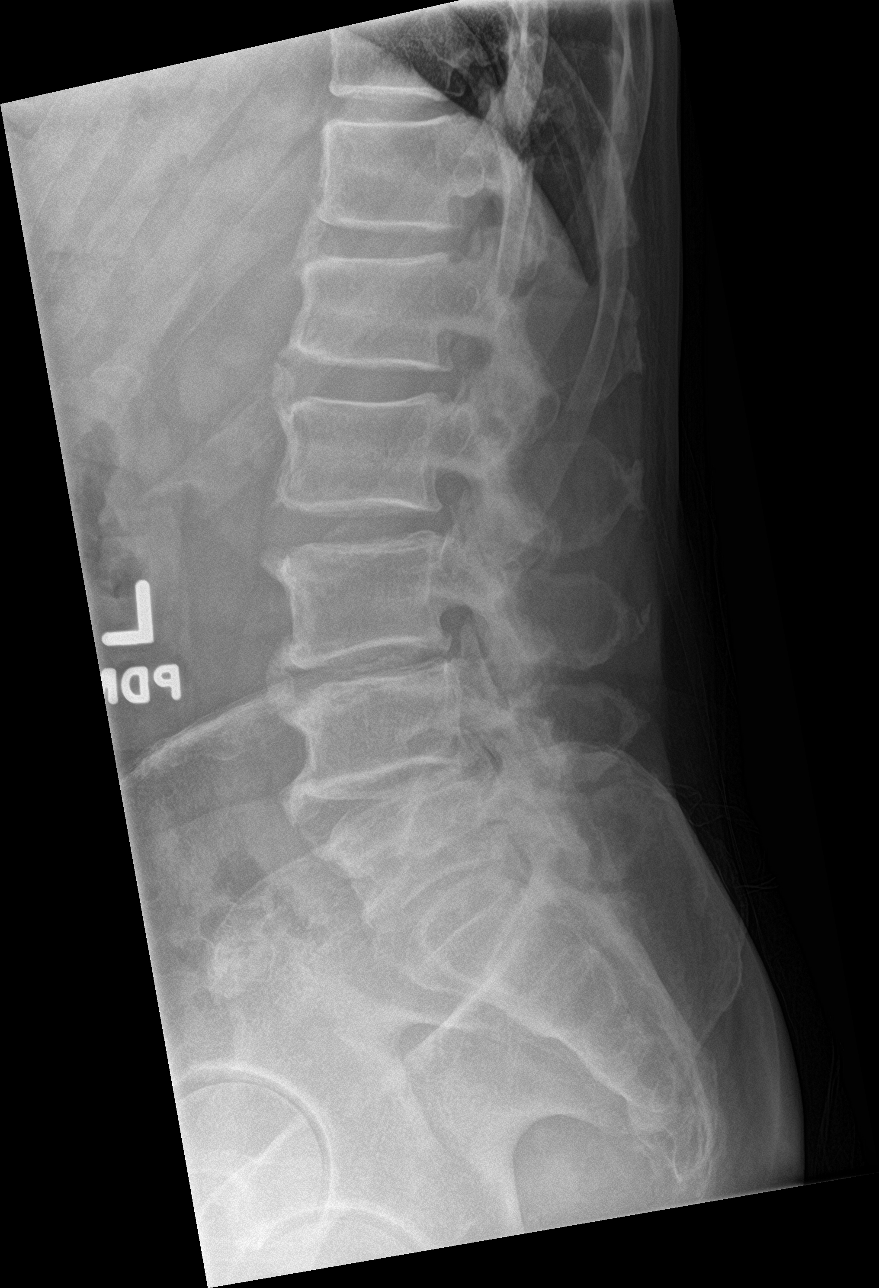

[l-spine spot]
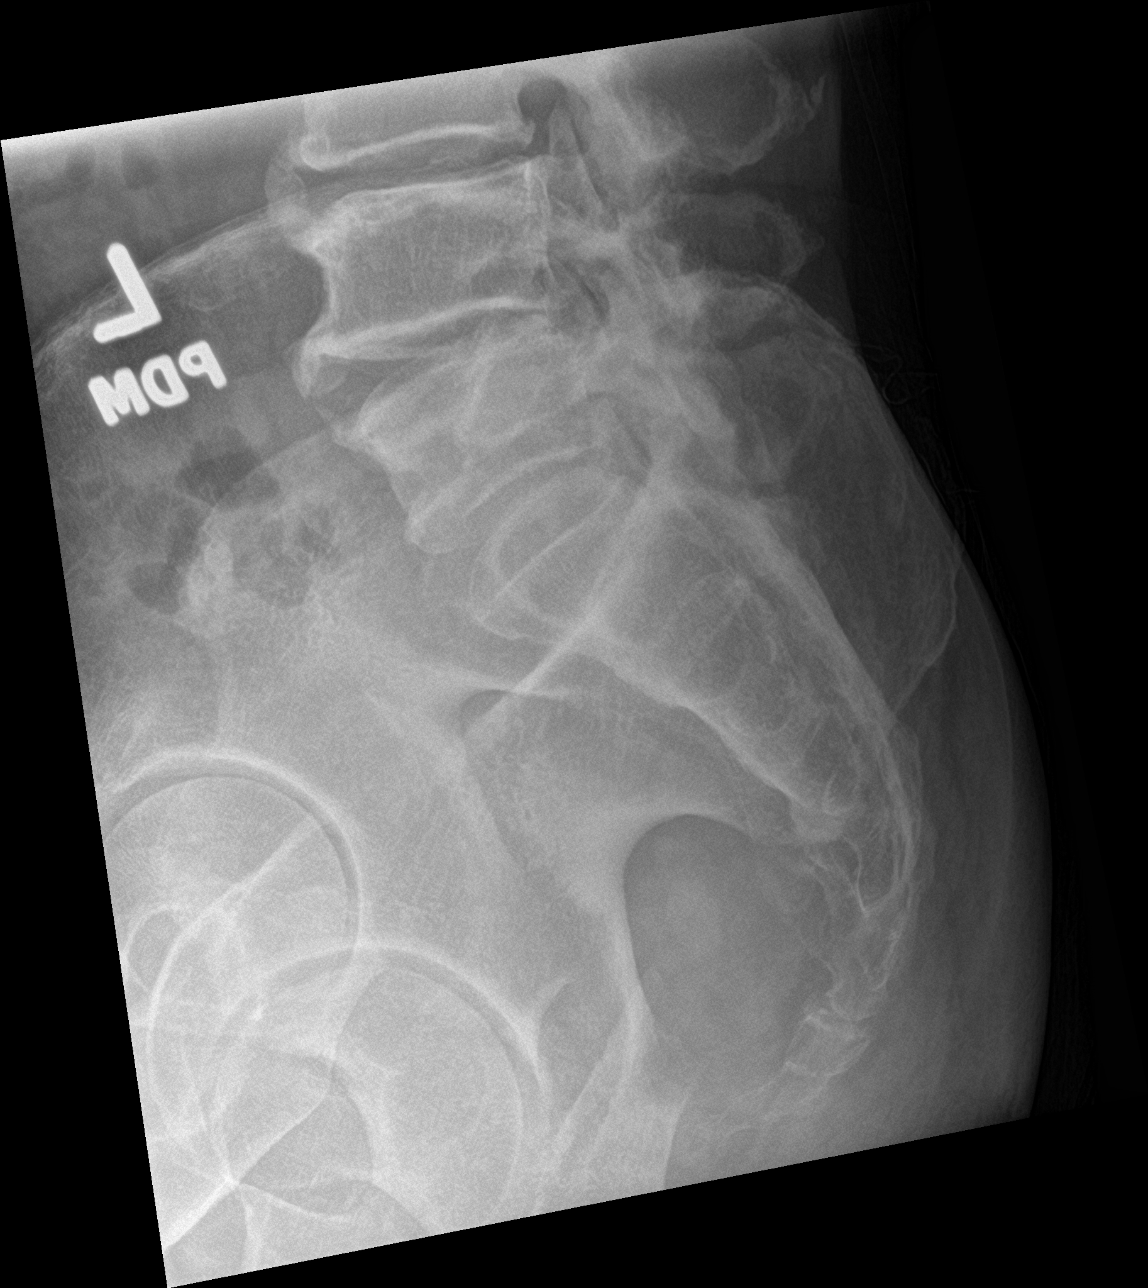

[5 of 5 positions shown; findings below may reference images not displayed]

FINDINGS: Diffuse degenerative disc and facet disease throughout the lumbar
spine. Normal alignment. No fracture. SI joints symmetric and
unremarkable.
IMPRESSION: Diffuse degenerative disc and facet disease. No acute bony
abnormality.

## 2021-08-17 NOTE — Telephone Encounter (Signed)
Patient stated need pain medication for back ache  Has appointment on 08/18/2021

## 2021-08-18 ENCOUNTER — Ambulatory Visit: Payer: Medicare (Managed Care) | Admitting: Family Medicine

## 2021-08-18 ENCOUNTER — Other Ambulatory Visit: Payer: Self-pay

## 2021-08-18 ENCOUNTER — Encounter: Payer: Self-pay | Admitting: Family Medicine

## 2021-08-18 VITALS — BP 161/94 | HR 66 | Temp 98.7°F | Ht 69.0 in | Wt 177.2 lb

## 2021-08-18 DIAGNOSIS — M545 Low back pain, unspecified: Secondary | ICD-10-CM | POA: Diagnosis not present

## 2021-08-18 DIAGNOSIS — I10 Essential (primary) hypertension: Secondary | ICD-10-CM | POA: Diagnosis not present

## 2021-08-18 DIAGNOSIS — K4090 Unilateral inguinal hernia, without obstruction or gangrene, not specified as recurrent: Secondary | ICD-10-CM | POA: Diagnosis not present

## 2021-08-18 MED ORDER — DICLOFENAC SODIUM 75 MG PO TBEC: 75.0000 mg | DELAYED_RELEASE_TABLET | Freq: Two times a day (BID) | ORAL | 5 refills | Status: DC

## 2021-08-18 MED ORDER — GABAPENTIN 100 MG PO CAPS: 100.0000 mg | ORAL_CAPSULE | Freq: Every day | ORAL | 3 refills | Status: AC

## 2021-08-18 NOTE — Assessment & Plan Note (Signed)
Symptoms are not controlled.  He has tried physical therapy and several medications without improvement.  Likely needs an MRI at this point.  No red flags today that we will place referral to orthopedics.    We will refill his diclofenac.  Start gabapentin 100 mg nightly.  Discussed reasons to return to care.

## 2021-08-18 NOTE — Patient Instructions (Signed)
It was very nice to see you today!  I will refill your diclofenac.  Please start the gabapentin.  I will refer you to see a back specialist.  Please keep an eye on your blood pressure and let us know if it is persistently elevated.  Take care, Dr [name]  PLEASE NOTE:  If you had any lab tests please let us know if you have not heard back within a few days. You may see your results on mychart before we have a chance to review them but we will give you a call once they are reviewed by [place]. If we ordered any referrals today, please let us know if you have not heard from their office within the next week.   Please try these tips to maintain a healthy lifestyle:  Eat at least 3 REAL meals and 1-2 snacks per day.  Aim for no more than 5 hours between eating.  If you eat breakfast, please do so within one hour of getting up.   Each meal should contain half fruits/vegetables, one quarter protein, and one quarter carbs (no bigger than a computer mouse)  Cut down on sweet beverages. This includes juice, soda, and sweet tea.   Drink at least 1 glass of water with each meal and aim for at least 8 glasses per day  Exercise at least 150 minutes every week.

## 2021-08-18 NOTE — Assessment & Plan Note (Signed)
He has possibly interested in surgery at some point though would like to have his back pain addressed first.

## 2021-08-18 NOTE — Assessment & Plan Note (Signed)
Slightly above goal today in setting of acute pain.  We will continue current regimen losartan 100 mg daily and HCTZ 25 mg daily.  Continue home monitoring goal 150/90 or lower.

## 2021-08-18 NOTE — Progress Notes (Signed)
   Albert [name] is a 75 y.o. male who presents today for an office visit.  Assessment/Plan:  Chronic Problems Addressed Today: Lumbar pain Symptoms are not controlled.  He has tried physical therapy and several medications without improvement.  Likely needs an MRI at this point.  No red flags today that we will place referral to orthopedics.    We will refill his diclofenac.  Start gabapentin 100 mg nightly.  Discussed reasons to return to care.  Essential hypertension Slightly above goal today in setting of acute pain.  We will continue current regimen losartan 100 mg daily and HCTZ 25 mg daily.  Continue home monitoring goal 150/90 or lower.  Inguinal hernia He has possibly interested in surgery at some point though would like to have his back pain addressed first.     Subjective:  HPI:  He is here to follow up on back pain with sciatica. This has been ongoing issue for over a year. Seen a different provider in the office about 10 months ago. He was referred to Dr. Clementeen Graham at that time. He also had x-ray done before which showed arthritis. His symptoms have worsened recently. Pain located in the lower back. He tried physical therapy in the past which did not seem to help. He has not tried any medication for pain management. No reported bowel or bladder incontinence.  Denies any urinary retention.        Objective:  Physical Exam: BP (!) 161/94   Pulse 66   Temp 98.7 F (37.1 C) (Temporal)   Ht 5\' 9"  (1.753 m)   Wt 177 lb 3.2 oz (80.4 kg)   SpO2 98%   BMI 26.17 kg/m   Gen: No acute distress, resting comfortably CV: Regular rate and rhythm with no murmurs appreciated Pulm: Normal work of breathing, clear to auscultation bilaterally with no crackles, wheezes, or rhonchi Neuro: Grossly normal, moves all extremities Psych: Normal affect and thought content       I,Savera Zaman,acting as a scribe for , MD.,have documented all relevant documentation on the  behalf of Jacquiline Doe, MD,as directed by  Jacquiline Doe, MD while in the presence of Jacquiline Doe, MD.   I, Jacquiline Doe, MD, have reviewed all documentation for this visit. The documentation on 08/18/21 for the exam, diagnosis, procedures, and orders are all accurate and complete.  08/20/21. Katina Degree, MD 08/18/2021 2:44 PM

## 2021-08-19 ENCOUNTER — Inpatient Hospital Stay: Payer: Medicare (Managed Care) | Admitting: Nurse Practitioner

## 2021-08-24 NOTE — Progress Notes (Signed)
New Hematology/Oncology Consult   Requesting MD: Chrys Racer PA  [phone]  Reason for Consult: Anemia  HPI: Albert [name] is a 75 year old man referred for evaluation of anemia.  He had a CBC 06/18/2021 with hemoglobin 11.8 (13.8-18), MCV 93.5, white count 4.81, platelet count 192,000; chemistry panel with ALT and AST mildly elevated, otherwise normal, specifically creatinine 1.1, bilirubin 0.8.  Comparison CBC from 11/07/2020-hemoglobin 12.4 (13-17), white count 4.3, monocytes 14.3% (3-12.0), platelet count 163,000.  Comparison CBC from 06/30/2020-hemoglobin 12.5 (13.8-18), white count 4.6, platelet count 171,000.  CBC done in our office on 07/21/2021-hemoglobin 11.6, MCV 88.5, white count 4.8, platelet count 192,000.   Past Medical History:  Diagnosis Date   Dyslipidemia 08/31/2018   Essential hypertension 08/31/2018   History of stroke 08/31/2018   Hypertension    Inguinal hernia 08/31/2018   Stroke [facility])     History reviewed. No pertinent surgical history.:   Current Outpatient Medications:    amLODipine (NORVASC) 10 MG tablet, Take by mouth daily., Disp: , Rfl:    atorvastatin (LIPITOR) 20 MG tablet, Take 20 mg by mouth daily., Disp: , Rfl:    diclofenac (VOLTAREN) 75 MG EC tablet, Take 1 tablet (75 mg total) by mouth 2 (two) times daily., Disp: 60 tablet, Rfl: 5   gabapentin (NEURONTIN) 100 MG capsule, Take 1 capsule (100 mg total) by mouth at bedtime., Disp: 90 capsule, Rfl: 3   hydrochlorothiazide (HYDRODIURIL) 25 MG tablet, Take 25 mg by mouth daily., Disp: , Rfl:    losartan (COZAAR) 50 MG tablet, Take 100 mg by mouth daily., Disp: , Rfl: :    Allergies  Allergen Reactions   Accupril [Quinapril Hcl] Swelling   Shellfish Allergy    Aspirin Nausea Only    FH: No family history of malignancy.  No family history of a blood disorder.  SOCIAL HISTORY: He lives in [place] and part of the time in [place].  No tobacco or alcohol use.  Review of Systems: He denies  bleeding.  He has a good appetite.  Sometimes he eats less due to back pain.  No fevers or sweats.  No unusual headache.  No vision change.  No dysphagia.  He sometimes has nausea after taking pain medication.  No shortness of breath.  No chest pain.  He has right posterior leg pain.  He reports having left-sided abdominal pain 8 to 9 months ago.  He was referred for a CT scan but states it could not be done due to insurance issues.  He did drink the contrast before being told he could not have the CT scan.  The pain subsequently resolved.  He reports a negative colonoscopy earlier this year.  He is concerned that he does not have a daily bowel movement.  He notes numbness in the right hand and both feet.  Physical Exam:  Blood pressure (!) 158/82, pulse 76, temperature 97.8 F (36.6 C), temperature source Oral, resp. rate 18, height 5\' 9"  (1.753 m), weight 176 lb 12.8 oz (80.2 kg), SpO2 100 %.  HEENT: No thrush or ulcers. Lungs: Lungs clear bilaterally. Cardiac: Regular rate and rhythm. Abdomen: Abdomen soft and nontender.  No hepatosplenomegaly. Vascular: No leg edema. Lymph nodes: No palpable cervical, supraclavicular, axillary or inguinal lymph nodes. Neurologic: Upper extremity motor strength 5/5.  Gait normal.  Alert and oriented.  LABS: Peripheral blood smear-platelets normal in number; few teardrops, rare target cell, polychromasia not increased; white blood cell morphology unremarkable.  RADIOLOGY:  No results found.  Assessment and  Plan:   Anemia, normocytic Hypertension History of CVA Back and right leg pain   Albert [name] is referred for evaluation of a normocytic anemia.  We discussed potential etiologies.  Additional labs will be obtained today including B12, ferritin, myeloma panel and chemistry panel.  We will contact him once those results are available.  He will return for a CBC and follow-up visit in 4 months.  He will contact the office in the interim with any  problems.  Patient seen with Dr. Truett Perna.    Lonna Cobb, NP 08/25/2021, 3:41 PM  This was a shared visit with Lonna Cobb.  Albert [name] was interviewed and examined.  I reviewed the peripheral blood smear.  He is referred for evaluation of anemia.  He has mild normocytic anemia.  There is no obvious explanation for the anemia upon review of his history and physical examination.  There is no clinical evidence of a malignancy.  We will obtain additional diagnostic laboratory evaluation today.  He will return for a follow-up CBC in approximately 4 months.  I was present for greater than 50% of today's visit.  I performed medical decision making.  Mancel Bale, MD

## 2021-08-25 ENCOUNTER — Inpatient Hospital Stay: Payer: Medicare (Managed Care) | Attending: Nurse Practitioner | Admitting: Nurse Practitioner

## 2021-08-25 ENCOUNTER — Other Ambulatory Visit: Payer: Self-pay

## 2021-08-25 ENCOUNTER — Encounter: Payer: Self-pay | Admitting: Nurse Practitioner

## 2021-08-25 ENCOUNTER — Inpatient Hospital Stay: Payer: Medicare (Managed Care)

## 2021-08-25 VITALS — BP 158/82 | HR 76 | Temp 97.8°F | Resp 18 | Ht 69.0 in | Wt 176.8 lb

## 2021-08-25 DIAGNOSIS — I10 Essential (primary) hypertension: Secondary | ICD-10-CM | POA: Insufficient documentation

## 2021-08-25 DIAGNOSIS — D649 Anemia, unspecified: Secondary | ICD-10-CM | POA: Diagnosis present

## 2021-08-25 DIAGNOSIS — Z8673 Personal history of transient ischemic attack (TIA), and cerebral infarction without residual deficits: Secondary | ICD-10-CM | POA: Diagnosis not present

## 2021-08-25 DIAGNOSIS — M549 Dorsalgia, unspecified: Secondary | ICD-10-CM | POA: Insufficient documentation

## 2021-08-25 DIAGNOSIS — M79606 Pain in leg, unspecified: Secondary | ICD-10-CM | POA: Diagnosis not present

## 2021-08-25 DIAGNOSIS — M79604 Pain in right leg: Secondary | ICD-10-CM | POA: Diagnosis not present

## 2021-08-25 DIAGNOSIS — Z886 Allergy status to analgesic agent status: Secondary | ICD-10-CM | POA: Diagnosis not present

## 2021-08-25 DIAGNOSIS — Z79899 Other long term (current) drug therapy: Secondary | ICD-10-CM | POA: Insufficient documentation

## 2021-08-25 LAB — CMP (CANCER CENTER ONLY)
ALT: 23 U/L (ref 0–44)
AST: 26 U/L (ref 15–41)
Albumin: 4.3 g/dL (ref 3.5–5.0)
Alkaline Phosphatase: 61 U/L (ref 38–126)
Anion gap: 7 (ref 5–15)
BUN: 16 mg/dL (ref 8–23)
CO2: 31 mmol/L (ref 22–32)
Calcium: 9.2 mg/dL (ref 8.9–10.3)
Chloride: 102 mmol/L (ref 98–111)
Creatinine: 1.25 mg/dL — ABNORMAL HIGH (ref 0.61–1.24)
GFR, Estimated: >60 mL/min (ref >60)
Glucose, Bld: 95 mg/dL (ref 70–99)
Potassium: 3.5 mmol/L (ref 3.5–5.1)
Sodium: 140 mmol/L (ref 135–145)
Total Bilirubin: 0.8 mg/dL (ref 0.3–1.2)
Total Protein: 7.7 g/dL (ref 6.5–8.1)

## 2021-08-25 LAB — VITAMIN B12: Vitamin B-12: 574 pg/mL (ref 180–914)

## 2021-08-25 LAB — FERRITIN: Ferritin: 171 ng/mL (ref 24–336)

## 2021-08-26 LAB — KAPPA/LAMBDA LIGHT CHAINS
Kappa free light chain: 28.8 mg/L — ABNORMAL HIGH (ref 3.3–19.4)
Kappa, lambda light chain ratio: 1.81 — ABNORMAL HIGH (ref 0.26–1.65)
Lambda free light chains: 15.9 mg/L (ref 5.7–26.3)

## 2021-09-01 LAB — MULTIPLE MYELOMA PANEL, SERUM
Albumin SerPl Elph-Mcnc: 4 g/dL (ref 2.9–4.4)
Albumin/Glob SerPl: 1.2 (ref 0.7–1.7)
Alpha 1: 0.2 g/dL (ref 0.0–0.4)
Alpha2 Glob SerPl Elph-Mcnc: 0.6 g/dL (ref 0.4–1.0)
B-Globulin SerPl Elph-Mcnc: 1.1 g/dL (ref 0.7–1.3)
Gamma Glob SerPl Elph-Mcnc: 1.4 g/dL (ref 0.4–1.8)
Globulin, Total: 3.4 g/dL (ref 2.2–3.9)
IgA: 400 mg/dL (ref 61–437)
IgG (Immunoglobin G), Serum: 1563 mg/dL (ref 603–1613)
IgM (Immunoglobulin M), Srm: 86 mg/dL (ref 15–143)
Total Protein ELP: 7.4 g/dL (ref 6.0–8.5)

## 2021-09-10 ENCOUNTER — Telehealth: Payer: Self-pay

## 2021-09-10 NOTE — Telephone Encounter (Signed)
-----   Message from Owens Shark, NP sent at 09/10/2021  8:21 AM EST ----- Please let him know the additional labs we did were unremarkable.  Recommend follow-up as scheduled.

## 2021-09-10 NOTE — Telephone Encounter (Signed)
Called and spoke with the patient to let him know the additional labs were unremarkable patient voiced understanding of instruction and had no further questions or concerns at this time

## 2021-12-24 ENCOUNTER — Inpatient Hospital Stay: Payer: Medicare PPO

## 2021-12-24 ENCOUNTER — Inpatient Hospital Stay: Payer: Medicare PPO | Attending: Nurse Practitioner | Admitting: Oncology

## 2021-12-24 ENCOUNTER — Telehealth: Payer: Self-pay

## 2021-12-24 NOTE — Telephone Encounter (Signed)
Pt called for missed appointment. Pt didn't remember her appointment. Pt transferred to scheduling to reschedule. ?

## 2021-12-28 NOTE — Progress Notes (Deleted)
  [facility] OFFICE PROGRESS NOTE   Diagnosis:   INTERVAL HISTORY:   ***  Objective:  Vital signs in last 24 hours:  There were no vitals taken for this visit.    HEENT: *** Lymphatics: *** Resp: *** Cardio: *** GI: *** Vascular: *** Neuro:***  Skin:***   Portacath/PICC-without erythema  Lab Results:  Lab Results  Component Value Date   WBC 4.8 07/21/2021   HGB 11.6 (L) 07/21/2021   HCT 32.3 (L) 07/21/2021   MCV 88.5 07/21/2021   PLT 192 07/21/2021   NEUTROABS 2.7 07/21/2021    CMP  Lab Results  Component Value Date   NA 140 08/25/2021   K 3.5 08/25/2021   CL 102 08/25/2021   CO2 31 08/25/2021   GLUCOSE 95 08/25/2021   BUN 16 08/25/2021   CREATININE 1.25 (H) 08/25/2021   CALCIUM 9.2 08/25/2021   PROT 7.7 08/25/2021   ALBUMIN 4.3 08/25/2021   AST 26 08/25/2021   ALT 23 08/25/2021   ALKPHOS 61 08/25/2021   BILITOT 0.8 08/25/2021   GFRNONAA >60 08/25/2021    Medications: I have reviewed the patient's current medications.   Assessment/Plan: Anemia, normocytic Hypertension History of CVA Back and right leg pain     Disposition: ***  Thornton Papas, MD  12/28/2021  8:35 PM

## 2021-12-29 ENCOUNTER — Inpatient Hospital Stay: Payer: Medicare PPO

## 2021-12-29 ENCOUNTER — Inpatient Hospital Stay: Payer: Medicare PPO | Admitting: Oncology

## 2021-12-31 ENCOUNTER — Telehealth: Payer: Self-pay | Admitting: Family Medicine

## 2021-12-31 NOTE — Telephone Encounter (Signed)
Patient called after hours requesting an apt with dr Jerline Pain - vml for pt ot call back and sch -  ?

## 2022-01-01 ENCOUNTER — Ambulatory Visit (INDEPENDENT_AMBULATORY_CARE_PROVIDER_SITE_OTHER): Payer: Medicare PPO | Admitting: Family Medicine

## 2022-01-01 ENCOUNTER — Encounter: Payer: Self-pay | Admitting: Family Medicine

## 2022-01-01 VITALS — BP 132/72 | HR 62 | Temp 97.3°F | Ht 69.0 in | Wt 167.6 lb

## 2022-01-01 DIAGNOSIS — Z0001 Encounter for general adult medical examination with abnormal findings: Secondary | ICD-10-CM | POA: Diagnosis not present

## 2022-01-01 DIAGNOSIS — I10 Essential (primary) hypertension: Secondary | ICD-10-CM

## 2022-01-01 DIAGNOSIS — K4090 Unilateral inguinal hernia, without obstruction or gangrene, not specified as recurrent: Secondary | ICD-10-CM

## 2022-01-01 DIAGNOSIS — E785 Hyperlipidemia, unspecified: Secondary | ICD-10-CM | POA: Diagnosis not present

## 2022-01-01 NOTE — Progress Notes (Signed)
? ?Chief Complaint:  ?Albert Albert [name] is a 76 y.o. male who presents today for his annual comprehensive physical exam.   ? ?Assessment/Plan:  ?Chronic Problems Addressed Today: ?Inguinal hernia ?We will place referral to surgery.  No red flag signs or symptoms today. ? ?Dyslipidemia ?Patient deferred getting labs done today.  Continue atorvastatin 20 mg daily.  We can recheck lipids next blood draw. ? ?Essential hypertension ?At goal today on HCTZ 25 mg daily and losartan 100 mg daily. ?  ?Preventative Healthcare: ?Deferred vaccines and labs.  Up-to-date on colon cancer screening. ? ?Patient Counseling(The following topics were reviewed and/or handout was given): ? -Nutrition: Stressed importance of moderation in sodium/caffeine intake, saturated fat and cholesterol, caloric balance, sufficient intake of fresh fruits, vegetables, and fiber. ? -Stressed the importance of regular exercise.  ? -Substance Abuse: Discussed cessation/primary prevention of tobacco, alcohol, or other drug use; driving or other dangerous activities under the influence; availability of treatment for abuse.  ? -Injury prevention: Discussed safety belts, safety helmets, smoke detector, smoking near bedding or upholstery.  ? -Sexuality: Discussed sexually transmitted diseases, partner selection, use of condoms, avoidance of unintended pregnancy and contraceptive alternatives.  ? -Dental health: Discussed importance of regular tooth brushing, flossing, and dental visits. ? -Health maintenance and immunizations reviewed. Please refer to Health maintenance section. ? ?Return to care in 1 year for next preventative visit.  ? ?  ?Subjective:  ?HPI: ? ?He has no acute complaints today.  ? ?He is concerned about a hernia. Started a few years ago. Located in left inguinal area. Albert [name] is getting worse. No nausea or vomiting.  ? ?Lifestyle ?Diet: Balanced. Plenty of fruits and vegetables.  ?Exercise: Limited due to Albert [name] from hernia.  ? ? ?  08/18/2021  ?   2:28 PM  ?Depression screen PHQ 2/9  ?Decreased Interest 0  ?Down, Depressed, Hopeless 0  ?PHQ - 2 Score 0  ? ? ?Health Maintenance Due  ?Topic Date Due  ? COVID-19 Vaccine (1) Never done  ? Hepatitis C Screening  Never done  ? Zoster Vaccines- Shingrix (1 of 2) Never done  ? Pneumonia Vaccine 6+ Years old (1 - PCV) Never done  ?  ? ?ROS: Per HPI, otherwise a complete review of systems was negative.  ? ?PMH: ? ?The following were reviewed and entered/updated in epic: ?Past Medical History:  ?Diagnosis Date  ? Dyslipidemia 08/31/2018  ? Essential hypertension 08/31/2018  ? History of stroke 08/31/2018  ? Hypertension   ? Inguinal hernia 08/31/2018  ? Stroke Albert Albert [name])   ? ?Patient Active Problem List  ? Diagnosis Date Noted  ? Carpal tunnel syndrome, bilateral 10/29/2020  ? Lumbar radiculopathy 10/29/2020  ? Lumbar Albert [name] 10/29/2020  ? Premature atrial contractions 05/16/2020  ? Essential hypertension 08/31/2018  ? Dyslipidemia 08/31/2018  ? History of stroke 08/31/2018  ? Inguinal hernia 08/31/2018  ? ?History reviewed. No pertinent surgical history. ? ?Family History  ?Problem Relation Age of Onset  ? Heart disease Mother   ? Hypertension Mother   ? Stroke Mother   ? Heart disease Father   ? Hypertension Father   ? Heart disease Brother   ? ? ?Medications- reviewed and updated ?Current Outpatient Medications  ?Medication Sig Dispense Refill  ? amLODipine (NORVASC) 10 MG tablet Take by mouth daily.    ? atorvastatin (LIPITOR) 20 MG tablet Take 20 mg by mouth daily.    ? diclofenac (VOLTAREN) 75 MG EC tablet Take 1 tablet (75 mg total) by mouth 2 (  two) times daily. 60 tablet 5  ? gabapentin (NEURONTIN) 100 MG capsule Take 1 capsule (100 mg total) by mouth at bedtime. 90 capsule 3  ? hydrochlorothiazide (HYDRODIURIL) 25 MG tablet Take 25 mg by mouth daily.    ? losartan (COZAAR) 50 MG tablet Take 100 mg by mouth daily.    ? ?No current facility-administered medications for this visit.  ? ? ?Allergies-reviewed and  updated ?Allergies  ?Allergen Reactions  ? Accupril [Quinapril Hcl] Swelling  ? Shellfish Allergy   ? Aspirin Nausea Only  ? ? ?Social History  ? ?Socioeconomic History  ? Marital status: Married  ?  Spouse name: Not on file  ? Number of children: Not on file  ? Years of education: Not on file  ? Highest education level: Not on file  ?Occupational History  ? Not on file  ?Tobacco Use  ? Smoking status: Former  ?  Types: Cigarettes  ?  Quit date: 16  ?  Years since quitting: 47.3  ? Smokeless tobacco: Never  ?Vaping Use  ? Vaping Use: Never used  ?Substance and Sexual Activity  ? Alcohol use: Not Currently  ? Drug use: Never  ? Sexual activity: Not on file  ?Other Topics Concern  ? Not on file  ?Social History Narrative  ? Not on file  ? ?Social Determinants of Health  ? ?Financial Resource Strain: Not on file  ?Food Insecurity: Not on file  ?Transportation Needs: Not on file  ?Physical Activity: Not on file  ?Stress: Not on file  ?Social Connections: Not on file  ? ?   ?  ?Objective:  ?Physical Exam: ?BP 132/72   Pulse 62   Temp (!) 97.3 ?F (36.3 ?C)   Ht 5\' 9"  (1.753 m)   Wt 167 lb 9.6 oz (76 kg)   SpO2 98%   BMI 24.75 kg/m?   ?Body mass index is 24.75 kg/m?. ?Wt Readings from Last 3 Encounters:  ?01/01/22 167 lb 9.6 oz (76 kg)  ?08/25/21 176 lb 12.8 oz (80.2 kg)  ?08/18/21 177 lb 3.2 oz (80.4 kg)  ? ?Gen: NAD, resting comfortably ?HEENT: TMs normal bilaterally. OP clear. No thyromegaly noted.  ?CV: RRR with no murmurs appreciated ?Pulm: NWOB, CTAB with no crackles, wheezes, or rhonchi ?GI: Normal bowel sounds present. Soft, Nontender, Nondistended. ?MSK: no edema, cyanosis, or clubbing noted ?GU: Obvious bulge noted in left inguinal area ?Skin: warm, dry ?Neuro: CN2-12 grossly intact. Strength 5/5 in upper and lower extremities. Reflexes symmetric and intact bilaterally.  ?Psych: Normal affect and thought content ?   ? ?Albert Albert [name]. Albert Pain, MD ?01/01/2022 3:07 PM  ?

## 2022-01-01 NOTE — Assessment & Plan Note (Signed)
We will place referral to surgery.  No red flag signs or symptoms today. ?

## 2022-01-01 NOTE — Assessment & Plan Note (Signed)
At goal today on HCTZ 25 mg daily and losartan 100 mg daily. ?

## 2022-01-01 NOTE — Assessment & Plan Note (Signed)
Patient deferred getting labs done today.  Continue atorvastatin 20 mg daily.  We can recheck lipids next blood draw. ?

## 2022-01-01 NOTE — Patient Instructions (Signed)
It was very nice to see you today! ? ?We will refer you to see a surgeon. ? ?We will see you back in 1 year for your next physical.  Please come back to see me sooner if needed. ? ?Take care, ?Dr [name] ? ?PLEASE NOTE: ? ?If you had any lab tests please let us know if you have not heard back within a few days. You may see your results on mychart before we have a chance to review them but we will give you a call once they are reviewed by [place]. If we ordered any referrals today, please let us know if you have not heard from their office within the next week.  ? ?Please try these tips to maintain a healthy lifestyle: ? ?Eat at least 3 REAL meals and 1-2 snacks per day.  Aim for no more than 5 hours between eating.  If you eat breakfast, please do so within one hour of getting up.  ? ?Each meal should contain half fruits/vegetables, one quarter protein, and one quarter carbs (no bigger than a computer mouse) ? ?Cut down on sweet beverages. This includes juice, soda, and sweet tea.  ? ?Drink at least 1 glass of water with each meal and aim for at least 8 glasses per day ? ?Exercise at least 150 minutes every week.   ? ?Preventive Care 33 Years and Older, Male ?Preventive care refers to lifestyle choices and visits with your health care provider that can promote health and wellness. Preventive care visits are also called wellness exams. ?What can I expect for my preventive care visit? ?Counseling ?During your preventive care visit, your health care provider may ask about your: ?Medical history, including: ?Past medical problems. ?Family medical history. ?History of falls. ?Current health, including: ?Emotional well-being. ?Home life and relationship well-being. ?Sexual activity. ?Memory and ability to understand (cognition). ?Lifestyle, including: ?Alcohol, nicotine or tobacco, and drug use. ?Access to firearms. ?Diet, exercise, and sleep habits. ?Work and work Astronomer. ?Sunscreen use. ?Safety issues such as seatbelt  and bike helmet use. ?Physical exam ?Your health care provider will check your: ?Height and weight. These may be used to calculate your BMI (body mass index). BMI is a measurement that tells if you are at a healthy weight. ?Waist circumference. This measures the distance around your waistline. This measurement also tells if you are at a healthy weight and may help predict your risk of certain diseases, such as type 2 diabetes and high blood pressure. ?Heart rate and blood pressure. ?Body temperature. ?Skin for abnormal spots. ?What immunizations do I need? ? ?Vaccines are usually given at various ages, according to a schedule. Your health care provider will recommend vaccines for you based on your age, medical history, and lifestyle or other factors, such as travel or where you work. ?What tests do I need? ?Screening ?Your health care provider may recommend screening tests for certain conditions. This may include: ?Lipid and cholesterol levels. ?Diabetes screening. This is done by checking your blood sugar (glucose) after you have not eaten for a while (fasting). ?Hepatitis C test. ?Hepatitis B test. ?HIV (human immunodeficiency virus) test. ?STI (sexually transmitted infection) testing, if you are at risk. ?Lung cancer screening. ?Colorectal cancer screening. ?Prostate cancer screening. ?Abdominal aortic aneurysm (AAA) screening. You may need this if you are a current or former smoker. ?Talk with your health care provider about your test results, treatment options, and if necessary, the need for more tests. ?Follow these instructions at home: ?Eating and drinking ? ?  Eat a diet that includes fresh fruits and vegetables, whole grains, lean protein, and low-fat dairy products. Limit your intake of foods with high amounts of sugar, saturated fats, and salt. ?Take vitamin and mineral supplements as recommended by your health care provider. ?Do not drink alcohol if your health care provider tells you not to drink. ?If  you drink alcohol: ?Limit how much you have to 0-2 drinks a day. ?Know how much alcohol is in your drink. In the [place] one drink equals one 12 oz bottle of beer (355 mL), one 5 oz glass of wine (148 mL), or one 1? oz glass of hard liquor (44 mL). ?Lifestyle ?Brush your teeth every morning and night with fluoride toothpaste. Floss one time each day. ?Exercise for at least 30 minutes 5 or more days each week. ?Do not use any products that contain nicotine or tobacco. These products include cigarettes, chewing tobacco, and vaping devices, such as e-cigarettes. If you need help quitting, ask your health care provider. ?Do not use drugs. ?If you are sexually active, practice safe sex. Use a condom or other form of protection to prevent STIs. ?Take aspirin only as told by your health care provider. Make sure that you understand how much to take and what form to take. Work with your health care provider to find out whether it is safe and beneficial for you to take aspirin daily. ?Ask your health care provider if you need to take a cholesterol-lowering medicine (statin). ?Find healthy ways to manage stress, such as: ?Meditation, yoga, or listening to music. ?Journaling. ?Talking to a trusted person. ?Spending time with friends and family. ?Safety ?Always wear your seat belt while driving or riding in a vehicle. ?Do not drive: ?If you have been drinking alcohol. Do not ride with someone who has been drinking. ?When you are tired or distracted. ?While texting. ?If you have been using any mind-altering substances or drugs. ?Wear a helmet and other protective equipment during sports activities. ?If you have firearms in your house, make sure you follow all gun safety procedures. ?Minimize exposure to UV radiation to reduce your risk of skin cancer. ?What's next? ?Visit your health care provider once a year for an annual wellness visit. ?Ask your health care provider how often you should have your eyes and teeth checked. ?Stay up  to date on all vaccines. ?This information is not intended to replace advice given to you by your health care provider. Make sure you discuss any questions you have with your health care provider. ?Document Revised: 02/18/2021 Document Reviewed: 02/18/2021 ?Elsevier Patient Education ? 2023 Elsevier Inc. ? ?

## 2022-01-22 DIAGNOSIS — K4090 Unilateral inguinal hernia, without obstruction or gangrene, not specified as recurrent: Secondary | ICD-10-CM | POA: Diagnosis not present

## 2022-03-04 ENCOUNTER — Other Ambulatory Visit: Payer: Self-pay | Admitting: Family Medicine

## 2022-03-04 ENCOUNTER — Other Ambulatory Visit: Payer: Self-pay | Admitting: *Deleted

## 2022-03-23 DIAGNOSIS — K4090 Unilateral inguinal hernia, without obstruction or gangrene, not specified as recurrent: Secondary | ICD-10-CM | POA: Diagnosis not present

## 2022-04-08 ENCOUNTER — Ambulatory Visit: Payer: Self-pay

## 2022-04-08 NOTE — Patient Outreach (Signed)
  Care Coordination   04/08/2022 Name: Albert [name] MRN: [id number] DOB: [birth year]   Care Coordination Outreach Attempts:  An unsuccessful telephone outreach was attempted today to offer the patient information about available care coordination services as a benefit of their health plan.   Follow Up Plan:  Additional outreach attempts will be made to offer the patient care coordination information and services.   Encounter Outcome:  Pt. Request to Call Back  Care Coordination Interventions Activated:  No   Care Coordination Interventions:  No, not indicated     Bevelyn Ngo, BSW, CDP Social Worker, Certified Dementia Practitioner Care Coordination [phone]

## 2022-05-04 ENCOUNTER — Telehealth: Payer: Self-pay | Admitting: Family Medicine

## 2022-05-04 ENCOUNTER — Other Ambulatory Visit: Payer: Self-pay

## 2022-05-04 NOTE — Telephone Encounter (Signed)
Ok with me. Please place any necessary orders. 

## 2022-05-04 NOTE — Telephone Encounter (Signed)
Patient is requesting refills of losartan, hydrochlorothiazide and norvasc to be sent to [facility] at [address].

## 2022-05-04 NOTE — Telephone Encounter (Signed)
Pt states he is out of 3 medications but did not list them.    Patient Name: Albert [name] Gender: Male DOB: 11/30/1945 Age: 76 Y 1 M 29 D Return Phone Number: [phone] (Primary) Address: City/ State/ Zip: [place]  [address] Client  [facility] - Human resources officer [facility] [employer] Provider Jacquiline Doe- MD Contact Type Call Who Is Calling Patient / Member / Family / Caregiver Call Type Triage / Clinical Relationship To Patient Self Return Phone Number [phone] (Primary) Chief Complaint Muscle pain Reason for Call Medication Question / Request Initial Comment Caller states he needs refills on 3 of his medications. He is completely out of his medications. He has been out for about 3 days not. He is having mild symptoms. It is a blood pressure medication. He says he is having a little bit of tension on the left side of his neck. Translation No Nurse Assessment Nurse: [name], RN, [place] Date/Time (Eastern Time): 05/03/2022 6:02:13 PM Confirm and document reason for call. If symptomatic, describe symptoms. ---Caller states he needs refills on 3 of his medications. They are his blood pressure medications. He has been out for 3 days. He says he is having a little bit of tension on the left side of his neck and chest. Does the patient have any new or worsening symptoms? ---Yes Will a triage be completed? ---Yes Related visit to physician within the last 2 weeks? ---No Does the PT have any chronic conditions? (i.e. diabetes, asthma, this includes High risk factors for pregnancy, etc.) ---Yes List chronic conditions. ---hypertension Is this a behavioral health or substance abuse call? ---No Nurse: [name], RN, [place] Date/Time (Eastern Time): 05/03/2022 6:09:34 PM Please select the assessment type ---Refill Additional Documentation ---Caller requesting refill of blood pressure medications. Does  the patient have enough medication to last until the office opens? ---Unable to obtain loaner dose from Pharmacy Nurse Assessment Does the client directives allow for assistance with medications after hours? ---No Additional Documentation ---Advised caller that this report would be sent to office, but to reach out in the morning during normal office hours. Guidelines Guideline Title Affirmed Question Affirmed Notes Nurse Date/Time [name] Time) Neck Pain or Stiffness Neck pain or stiffness [name], RN, [facility] 05/03/2022 6:05:56 PM Disp. Time [name] Time) Disposition Final User 05/03/2022 6:08:30 PM Home Care Yes [name], RN, [place] Final Disposition 05/03/2022 6:08:30 PM Home Care Yes [name], RN, [place] Caller Disagree/Comply Comply Caller Understands Yes PreDisposition Call Doctor Care Advice Given Per Guideline HOME CARE: * You should be able to treat this at home. PAIN MEDICINES: * For pain relief, you can take either acetaminophen, ibuprofen, or naproxen. * They are over-the-counter (OTC) pain drugs. You can buy them at the drugstore. STRETCHING EXERCISES: * Improve the strength of the neck muscles with 2 or 3 minutes of gentle stretching exercises each day. CALL BACK IF: * Moderate pain (e.g., interferes with normal activities) lasts over 3 days * Pain lasts over 2 weeks * Pain begins to shoot into the arms * You become worse CARE ADVICE given per Neck Pain (Adult) guideline. Comments User: [name], RN Date/Time [name] Time): 05/03/2022 6:09:03 PM Caller states no pain or tightness in his chest.

## 2022-05-05 ENCOUNTER — Other Ambulatory Visit: Payer: Self-pay | Admitting: *Deleted

## 2022-05-05 MED ORDER — AMLODIPINE BESYLATE 10 MG PO TABS: 10.0000 mg | ORAL_TABLET | Freq: Every day | ORAL | 1 refills | Status: DC

## 2022-05-05 MED ORDER — LOSARTAN POTASSIUM 50 MG PO TABS: 100.0000 mg | ORAL_TABLET | Freq: Every day | ORAL | 1 refills | Status: DC

## 2022-05-05 MED ORDER — HYDROCHLOROTHIAZIDE 25 MG PO TABS: 25.0000 mg | ORAL_TABLET | Freq: Every day | ORAL | 1 refills | Status: DC

## 2022-05-05 NOTE — Telephone Encounter (Signed)
Rx send to pharam[facility]

## 2022-05-13 ENCOUNTER — Ambulatory Visit: Payer: Self-pay

## 2022-05-13 NOTE — Patient Outreach (Signed)
  Care Coordination   Initial Visit Note   05/13/2022 Name: Zacariah Belue MRN: [id number] DOB: November 11, 1945  Bing Duffey is a 76 y.o. year old male who sees Ardith Dark, MD for primary care. I spoke with  [name] by phone today.  What matters to the patients health and wellness today?  No concerns, doing well at this time    Goals Addressed             This Visit's Progress    COMPLETED: Care Coordination Activities       Care Coordination Interventions: SDoH screening performed - no acute resource challenges identified at this time Determined the patient does not have concerns with medication costs and/or adherence at this time Discussed the patient is in process of applying for Medicaid and has submitted his application to DSS Education on role of care coordination team provided to the patient - no follow up desired at this time Instructed the patient to contact his primary care provider as needed         SDOH assessments and interventions completed:  Yes  SDOH Interventions Today    Flowsheet Row Most Recent Value  SDOH Interventions   Food Insecurity Interventions Intervention Not Indicated  Housing Interventions Intervention Not Indicated  Transportation Interventions Intervention Not Indicated  Utilities Interventions Intervention Not Indicated  Financial Strain Interventions Intervention Not Indicated        Care Coordination Interventions Activated:  Yes  Care Coordination Interventions:  Yes, provided   Follow up plan: No further intervention required.   Encounter Outcome:  Pt. Visit Completed   Bevelyn Ngo, BSW, CDP Social Worker, Certified Dementia Practitioner Care Coordination [phone]

## 2022-05-13 NOTE — Patient Instructions (Signed)
Visit Information  Thank you for taking time to visit with me today. Please don't hesitate to contact me if I can be of assistance to you.   Following are the goals we discussed today:   Goals Addressed             This Visit's Progress    COMPLETED: Care Coordination Activities       Care Coordination Interventions: SDoH screening performed - no acute resource challenges identified at this time Determined the patient does not have concerns with medication costs and/or adherence at this time Discussed the patient is in process of applying for Medicaid and has submitted his application to DSS Education on role of care coordination team provided to the patient - no follow up desired at this time Instructed the patient to contact his primary care provider as needed         Please call the care guide team at [phone] if you need to schedule an appointment with our care coordination team.  If you are experiencing a Mental Health or Behavioral Health Crisis or need someone to talk to, please call 1-800-273-TALK (toll free, 24 hour hotline)  Patient verbalizes understanding of instructions and care plan provided today and agrees to view in MyChart. Active MyChart status and patient understanding of how to access instructions and care plan via MyChart confirmed with patient.     No further follow up required: Please contact your primary care provider as needed.  Bevelyn Ngo, BSW, CDP Social Worker, Certified Dementia Practitioner Care Coordination [phone]

## 2022-06-11 ENCOUNTER — Ambulatory Visit: Payer: Medicare PPO | Admitting: *Deleted

## 2022-06-11 NOTE — Progress Notes (Signed)
Erroneous encounter

## 2022-06-11 NOTE — Patient Instructions (Signed)
Health Maintenance, Male Adopting a healthy lifestyle and getting preventive care are important in promoting health and wellness. Ask your health care provider about: The right schedule for you to have regular tests and exams. Things you can do on your own to prevent diseases and keep yourself healthy. What should I know about diet, weight, and exercise? Eat a healthy diet  Eat a diet that includes plenty of vegetables, fruits, low-fat dairy products, and lean protein. Do not eat a lot of foods that are high in solid fats, added sugars, or sodium. Maintain a healthy weight Body mass index (BMI) is a measurement that can be used to identify possible weight problems. It estimates body fat based on height and weight. Your health care provider can help determine your BMI and help you achieve or maintain a healthy weight. Get regular exercise Get regular exercise. This is one of the most important things you can do for your health. Most adults should: Exercise for at least 150 minutes each week. The exercise should increase your heart rate and make you sweat (moderate-intensity exercise). Do strengthening exercises at least twice a week. This is in addition to the moderate-intensity exercise. Spend less time sitting. Even light physical activity can be beneficial. Watch cholesterol and blood lipids Have your blood tested for lipids and cholesterol at 76 years of age, then have this test every 5 years. You may need to have your cholesterol levels checked more often if: Your lipid or cholesterol levels are high. You are older than 76 years of age. You are at high risk for heart disease. What should I know about cancer screening? Many types of cancers can be detected early and may often be prevented. Depending on your health history and family history, you may need to have cancer screening at various ages. This may include screening for: Colorectal cancer. Prostate cancer. Skin cancer. Lung  cancer. What should I know about heart disease, diabetes, and high blood pressure? Blood pressure and heart disease High blood pressure causes heart disease and increases the risk of stroke. This is more likely to develop in people who have high blood pressure readings or are overweight. Talk with your health care provider about your target blood pressure readings. Have your blood pressure checked: Every 3-5 years if you are 18-39 years of age. Every year if you are 40 years old or older. If you are between the ages of 65 and 75 and are a current or former smoker, ask your health care provider if you should have a one-time screening for abdominal aortic aneurysm (AAA). Diabetes Have regular diabetes screenings. This checks your fasting blood sugar level. Have the screening done: Once every three years after age 45 if you are at a normal weight and have a low risk for diabetes. More often and at a younger age if you are overweight or have a high risk for diabetes. What should I know about preventing infection? Hepatitis B If you have a higher risk for hepatitis B, you should be screened for this virus. Talk with your health care provider to find out if you are at risk for hepatitis B infection. Hepatitis C Blood testing is recommended for: Everyone born from 1945 through 1965. Anyone with known risk factors for hepatitis C. Sexually transmitted infections (STIs) You should be screened each year for STIs, including gonorrhea and chlamydia, if: You are sexually active and are younger than 76 years of age. You are older than 76 years of age and your   health care provider tells you that you are at risk for this type of infection. Your sexual activity has changed since you were last screened, and you are at increased risk for chlamydia or gonorrhea. Ask your health care provider if you are at risk. Ask your health care provider about whether you are at high risk for HIV. Your health care provider  may recommend a prescription medicine to help prevent HIV infection. If you choose to take medicine to prevent HIV, you should first get tested for HIV. You should then be tested every 3 months for as long as you are taking the medicine. Follow these instructions at home: Alcohol use Do not drink alcohol if your health care provider tells you not to drink. If you drink alcohol: Limit how much you have to 0-2 drinks a day. Know how much alcohol is in your drink. In the [place] one drink equals one 12 oz bottle of beer (355 mL), one 5 oz glass of Ariq Khamis (148 mL), or one 1 oz glass of hard liquor (44 mL). Lifestyle Do not use any products that contain nicotine or tobacco. These products include cigarettes, chewing tobacco, and vaping devices, such as e-cigarettes. If you need help quitting, ask your health care provider. Do not use street drugs. Do not share needles. Ask your health care provider for help if you need support or information about quitting drugs. General instructions Schedule regular health, dental, and eye exams. Stay current with your vaccines. Tell your health care provider if: You often feel depressed. You have ever been abused or do not feel safe at home. Summary Adopting a healthy lifestyle and getting preventive care are important in promoting health and wellness. Follow your health care provider's instructions about healthy diet, exercising, and getting tested or screened for diseases. Follow your health care provider's instructions on monitoring your cholesterol and blood pressure. This information is not intended to replace advice given to you by your health care provider. Make sure you discuss any questions you have with your health care provider. Document Revised: 01/12/2021 Document Reviewed: 01/12/2021 Elsevier Patient Education  2023 [employer] Inc[employer]  

## 2022-07-03 ENCOUNTER — Other Ambulatory Visit: Payer: Self-pay | Admitting: Family Medicine

## 2022-10-25 ENCOUNTER — Other Ambulatory Visit: Payer: Self-pay | Admitting: Family Medicine

## 2022-10-25 NOTE — Progress Notes (Deleted)
Office Visit    Patient Name: Albert [name] Date of Encounter: 10/25/2022  Primary Care Provider:  Vivi Barrack, MD Primary Cardiologist:  Werner Lean, MD Primary Electrophysiologist: None  Chief Complaint    Albert [name] is a 77 y.o. male with PMH of HTN, HLD, CVA 2018 who presents today for overdue annual follow-up.  Past Medical History    Past Medical History:  Diagnosis Date   Dyslipidemia 08/31/2018   Essential hypertension 08/31/2018   History of stroke 08/31/2018   Hypertension    Inguinal hernia 08/31/2018   Stroke ([place])    No past surgical history on file.  Allergies  Allergies  Allergen Reactions   Accupril [Quinapril Hcl] Swelling   Shellfish Allergy    Aspirin Nausea Only    History of Present Illness    Albert [name]  is a 77 year old male with the above mention past medical history who presents today for overdue annual follow-up.  Albert [name] was initially seen on 05/2020 to establish care with Dr. Gasper Sells for management of coronary disease and CVA.  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  He relocated to [facility] from [place] and is currently working for a [employer] and has DOT license.  During his visit patient had no complaints of chest pain or syncope.  Most recent 2D echo was completed 01/2018 with EF of 60-65% with no RWMA, mild concentric LVH with trace pulmonic valve regurgitation and trace TV regurgitation.   Since last being seen in the office patient reports***.  Patient denies chest pain, palpitations, dyspnea, PND, orthopnea, nausea, vomiting, dizziness, syncope, edema, weight gain, or early satiety.     ***Notes:  Home Medications    Current Outpatient Medications  Medication Sig Dispense Refill   amLODipine (NORVASC) 10 MG tablet TAKE 1 TABLET BY MOUTH EVERY DAY 90 tablet 1    atorvastatin (LIPITOR) 20 MG tablet Take 20 mg by mouth daily.     diclofenac (VOLTAREN) 75 MG EC tablet TAKE 1 TABLET BY MOUTH TWICE A DAY 60 tablet 5   gabapentin (NEURONTIN) 100 MG capsule Take 1 capsule (100 mg total) by mouth at bedtime. 90 capsule 3   hydrochlorothiazide (HYDRODIURIL) 25 MG tablet Take 1 tablet (25 mg total) by mouth daily. 90 tablet 1   losartan (COZAAR) 50 MG tablet TAKE 2 TABLETS BY MOUTH EVERY DAY 180 tablet 0   No current facility-administered medications for this visit.     Review of Systems  Please see the history of present illness.    (+)*** (+)***  All other systems reviewed and are otherwise negative except as noted above.  Physical Exam    Wt Readings from Last 3 Encounters:  01/01/22 167 lb 9.6 oz (76 kg)  08/25/21 176 lb 12.8 oz (80.2 kg)  08/18/21 177 lb 3.2 oz (80.4 kg)   [id number] were no vitals filed for this visit.,There is no height or weight on file to calculate BMI.  Constitutional:      Appearance: Healthy appearance. Not in distress.  Neck:     Vascular: JVD normal.  Pulmonary:     Effort: Pulmonary effort is normal.     Breath sounds: No wheezing. No rales. Diminished in the bases Cardiovascular:     Normal rate. Regular rhythm. Normal S1. Normal S2.      Murmurs: There is no murmur.  Edema:    Peripheral edema absent.  Abdominal:     Palpations: Abdomen is soft non tender. There is no hepatomegaly.  Skin:    General: Skin is warm and dry.  Neurological:     General: No focal deficit present.     Mental Status: Alert and oriented to person, place and time.     Cranial Nerves: Cranial nerves are intact.  EKG/LABS/Other Studies Reviewed    ECG personally reviewed by me today - ***  Risk Assessment/Calculations:   {Does this patient have ATRIAL FIBRILLATION?[phone]}        Lab Results  Component Value Date   WBC 4.8 07/21/2021   HGB 11.6 (L) 07/21/2021   HCT 32.3 (L) 07/21/2021   MCV 88.5 07/21/2021   PLT 192  07/21/2021   Lab Results  Component Value Date   CREATININE 1.25 (H) 08/25/2021   BUN 16 08/25/2021   NA 140 08/25/2021   K 3.5 08/25/2021   CL 102 08/25/2021   CO2 31 08/25/2021   Lab Results  Component Value Date   ALT 23 08/25/2021   AST 26 08/25/2021   ALKPHOS 61 08/25/2021   BILITOT 0.8 08/25/2021   Lab Results  Component Value Date   CHOL 113 05/16/2020   HDL 49 05/16/2020   LDLCALC 52 05/16/2020   TRIG 48 05/16/2020   CHOLHDL 2.3 05/16/2020    No results found for: "HGBA1C"  Assessment & Plan    1.  History of ischemic stroke: -s/p right capsular infarct 01/2018 with left-sided weakness and no residual effects noted. -Today patient reports***  2.  Essential hypertension: -Patient's blood pressure today was***  3.  Hyperlipidemia: -Patient's last LDL cholesterol was  4.  PACs: -Today patient reports***      Disposition: Follow-up with Werner Lean, MD or APP in *** months {Are you ordering a CV Procedure (e.g. stress test, cath, DCCV, TEE, etc)?   Press F2        :[id number]   Medication Adjustments/Labs and Tests Ordered: Current medicines are reviewed at length with the patient today.  Concerns regarding medicines are outlined above.   Signed, Mable Fill, Marissa Nestle, NP 10/25/2022, 12:51 PM [facility]  Note:  This document was prepared using Dragon voice recognition software and may include unintentional dictation errors.

## 2022-10-26 ENCOUNTER — Ambulatory Visit: Payer: Medicare PPO | Admitting: Nurse Practitioner

## 2022-10-26 ENCOUNTER — Other Ambulatory Visit: Payer: Self-pay | Admitting: Family Medicine

## 2022-10-26 DIAGNOSIS — Z8673 Personal history of transient ischemic attack (TIA), and cerebral infarction without residual deficits: Secondary | ICD-10-CM

## 2022-10-26 DIAGNOSIS — I10 Essential (primary) hypertension: Secondary | ICD-10-CM

## 2022-10-26 DIAGNOSIS — E785 Hyperlipidemia, unspecified: Secondary | ICD-10-CM

## 2022-10-26 DIAGNOSIS — I491 Atrial premature depolarization: Secondary | ICD-10-CM

## 2022-10-28 ENCOUNTER — Telehealth: Payer: Self-pay | Admitting: *Deleted

## 2022-10-28 NOTE — Progress Notes (Signed)
  Care Coordination  Outreach Note  10/28/2022 Name: Corban Yother MRN: [id number] DOB: 1945-12-07   Care Coordination Outreach Attempts: An unsuccessful telephone outreach was attempted today to offer the patient information about available care coordination services as a benefit of their health plan.   Follow Up Plan:  Additional outreach attempts will be made to offer the patient care coordination information and services.   Encounter Outcome:  No Answer  Julian Hy, [place] Direct Dial: [phone]

## 2022-11-01 NOTE — Progress Notes (Signed)
  Care Coordination  Outreach Note  11/01/2022 Name: Albert [name] MRN: [id number] DOB: 29-Jun-1946   Care Coordination Outreach Attempts: A second unsuccessful outreach was attempted today to offer the patient with information about available care coordination services as a benefit of their health plan.     Follow Up Plan:  Additional outreach attempts will be made to offer the patient care coordination information and services.   Encounter Outcome:  No Answer  Julian Hy, [place] Direct Dial: [phone]

## 2022-11-23 NOTE — Progress Notes (Signed)
  Care Coordination  Outreach Note  11/23/2022 Name: Albert [name] MRN: [id number] DOB: [birth year]   Care Coordination Outreach Attempts: A third unsuccessful outreach was attempted today to offer the patient with information about available care coordination services as a benefit of their health plan.   Follow Up Plan:  No further outreach attempts will be made at this time. We have been unable to contact the patient to offer or enroll patient in care coordination services  Encounter Outcome:  No Answer  Julian Hy, [place] Direct Dial: [phone]

## 2023-01-03 ENCOUNTER — Encounter: Payer: Medicare PPO | Admitting: Family Medicine

## 2023-01-22 ENCOUNTER — Other Ambulatory Visit: Payer: Self-pay | Admitting: Family Medicine

## 2023-01-23 ENCOUNTER — Other Ambulatory Visit: Payer: Self-pay | Admitting: Family Medicine

## 2023-02-23 ENCOUNTER — Other Ambulatory Visit: Payer: Self-pay | Admitting: Family Medicine

## 2023-03-11 ENCOUNTER — Other Ambulatory Visit: Payer: Self-pay | Admitting: Family Medicine

## 2023-03-24 ENCOUNTER — Other Ambulatory Visit: Payer: Self-pay | Admitting: Family Medicine

## 2023-04-21 ENCOUNTER — Other Ambulatory Visit: Payer: Self-pay | Admitting: Family Medicine

## 2023-05-02 ENCOUNTER — Other Ambulatory Visit: Payer: Self-pay | Admitting: Family Medicine

## 2023-05-23 ENCOUNTER — Other Ambulatory Visit: Payer: Self-pay | Admitting: Family Medicine

## 2023-05-24 ENCOUNTER — Other Ambulatory Visit: Payer: Self-pay | Admitting: Family Medicine
